# Patient Record
Sex: Male | Born: 1985 | Race: Black or African American | Hispanic: No | Marital: Single | Smoking: Current every day smoker
Health system: Southern US, Community
[De-identification: ages and names within clinical notes are randomized; demographics above are authoritative.]

## PROBLEM LIST (undated history)

## (undated) DIAGNOSIS — R51 Headache: Secondary | ICD-10-CM

## (undated) DIAGNOSIS — R519 Headache, unspecified: Secondary | ICD-10-CM

## (undated) DIAGNOSIS — G510 Bell's palsy: Secondary | ICD-10-CM

## (undated) DIAGNOSIS — J45909 Unspecified asthma, uncomplicated: Secondary | ICD-10-CM

---
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
## 2007-09-26 ENCOUNTER — Emergency Department (HOSPITAL_COMMUNITY): Admission: EM | Admit: 2007-09-26 | Discharge: 2007-09-26 | Payer: Self-pay | Admitting: Emergency Medicine

## 2009-09-27 ENCOUNTER — Observation Stay (HOSPITAL_COMMUNITY): Admission: EM | Admit: 2009-09-27 | Discharge: 2009-09-27 | Payer: Self-pay | Admitting: Emergency Medicine

## 2009-10-26 ENCOUNTER — Emergency Department (HOSPITAL_COMMUNITY): Admission: EM | Admit: 2009-10-26 | Discharge: 2009-10-26 | Payer: Self-pay | Admitting: Emergency Medicine

## 2012-03-30 ENCOUNTER — Emergency Department (HOSPITAL_COMMUNITY)
Admission: EM | Admit: 2012-03-30 | Discharge: 2012-03-31 | Disposition: A | Discharge status: Home / Self Care | Payer: Self-pay | Attending: Emergency Medicine | Admitting: Emergency Medicine

## 2012-03-30 ENCOUNTER — Encounter (HOSPITAL_COMMUNITY): Payer: Self-pay | Admitting: Adult Health

## 2012-03-30 DIAGNOSIS — G510 Bell's palsy: Secondary | ICD-10-CM

## 2012-03-30 DIAGNOSIS — F17200 Nicotine dependence, unspecified, uncomplicated: Secondary | ICD-10-CM | POA: Insufficient documentation

## 2012-03-30 DIAGNOSIS — R29810 Facial weakness: Secondary | ICD-10-CM | POA: Insufficient documentation

## 2012-03-30 HISTORY — DX: Headache, unspecified: R51.9

## 2012-03-30 HISTORY — DX: Headache: R51

## 2012-03-30 NOTE — ED Notes (Signed)
Reports one week of left sided facial paralysis that began one week ago, unable to left left eyebrow, unable to smile with left side of face associated with more frequent headaches than normal. Alert and oriented, mae x 4.

## 2012-03-31 MED ORDER — ACYCLOVIR 400 MG PO TABS: 400.0000 mg | ORAL_TABLET | Freq: Four times a day (QID) | ORAL | Status: AC

## 2012-03-31 MED ORDER — ARTIFICIAL TEARS OP OINT
TOPICAL_OINTMENT | OPHTHALMIC | Status: DC | PRN
  Administered 2012-03-31: 1 via OPHTHALMIC
  Filled 2012-03-31: qty 3.5

## 2012-03-31 MED ORDER — PREDNISONE 20 MG PO TABS: ORAL_TABLET | ORAL | Status: AC

## 2012-03-31 NOTE — ED Provider Notes (Signed)
History     CSN: [id number]  Arrival date & time 03/30/12  2100   First MD Initiated Contact with Patient 03/31/12 0015      Chief Complaint  Patient presents with  . Facial Droop    left sided facial paralysis that began one week ago    (Consider location/radiation/quality/duration/timing/severity/associated sxs/prior treatment) HPI History provided by patient. Left-sided facial droop the last week unable to close left eye. Has not been evaluated for this. No recent cough cold or congestion. Denies any recent nausea vomiting diarrhea or illness otherwise. No history of same. No headache. No trouble with vision. No weakness or numbness otherwise. No trouble with gait. Denies any drug use. No fall or trauma. Moderate in severity. Denies recent tick bites or rash. No ear pain or drainage. Past Medical History  Diagnosis Date  . Persistent headaches     History reviewed. No pertinent past surgical history.  History reviewed. No pertinent family history.  History  Substance Use Topics  . Smoking status: Current Everyday Smoker  . Smokeless tobacco: Not on file  . Alcohol Use: Yes      Review of Systems  Constitutional: Negative for fever and chills.  HENT: Negative for neck pain and neck stiffness.   Eyes: Negative for pain.  Respiratory: Negative for shortness of breath.   Cardiovascular: Negative for chest pain.  Gastrointestinal: Negative for abdominal pain.  Genitourinary: Negative for dysuria.  Musculoskeletal: Negative for back pain.  Skin: Negative for rash.  Neurological: Positive for facial asymmetry. Negative for headaches.  All other systems reviewed and are negative.    Allergies  Review of patient's allergies indicates no known allergies.  Home Medications   Current Outpatient Rx  Name Route Sig Dispense Refill  . IBUPROFEN 200 MG PO TABS Oral Take 200 mg by mouth every 6 (six) hours as needed. For pain      BP 113/67  Pulse 78  Temp 98.9 F  (37.2 C) (Oral)  Resp 18  SpO2 100%  Physical Exam  Constitutional: He is oriented to person, place, and time. He appears well-developed and well-nourished.  HENT:  Head: Normocephalic and atraumatic.  Right Ear: External ear normal.  Left Ear: External ear normal.  Nose: Nose normal.  Mouth/Throat: Oropharynx is clear and moist.  Eyes: Conjunctivae and EOM are normal. Pupils are equal, round, and reactive to light.  Neck: Trachea normal. Neck supple. No thyromegaly present.  Cardiovascular: Normal rate, regular rhythm, S1 normal, S2 normal and normal pulses.     No systolic murmur is present   No diastolic murmur is present  Pulses:      Radial pulses are 2+ on the right side, and 2+ on the left side.  Pulmonary/Chest: Effort normal and breath sounds normal. He has no wheezes. He has no rhonchi. He has no rales. He exhibits no tenderness.  Abdominal: Soft. Normal appearance and bowel sounds are normal. There is no tenderness. There is no CVA tenderness and negative Murphy's sign.  Musculoskeletal:       BLE:s Calves nontender, no cords or erythema, negative Homans sign  Neurological: He is alert and oriented to person, place, and time. He has normal strength. No sensory deficit. GCS eye subscore is 4. GCS verbal subscore is 5. GCS motor subscore is 6.       Left cranial nerve 7 deficit involving forehead and consistent with Bell's palsy.  Skin: Skin is warm and dry. No rash noted. He is not diaphoretic.  Psychiatric:  His speech is normal.       Cooperative and appropriate    ED Course  Procedures (including critical care time)  Unable to fully close left eye  - Lacri-Lube provided and patient agrees to use over-the-counter saline drops until able to close his eye.   MDM   Left facial weakness consistent with Bell's palsy. No rash or recent tick bite / no obvious Lyme disease. Plan acyclovir and prednisone and followup as an outpatient in the clinic. Patient states  understanding need for lubrication left eye while unable to close it.         Sunnie Nielsen, MD 03/31/12 [phone]

## 2012-03-31 NOTE — Discharge Instructions (Signed)
Bell's Palsy Bell's palsy is a condition in which the muscles on one side of the face cannot move (paralysis). This is because the nerves in the face are paralyzed. It is most often thought to be caused by a virus. The virus causes swelling of the nerve that controls movement on one side of the face. The nerve travels through a tight space surrounded by bone. When the nerve swells, it can be compressed by the bone. This results in damage to the protective covering around the nerve. This damage interferes with how the nerve communicates with the muscles of the face. As a result, it can cause weakness or paralysis of the facial muscles.  Injury (trauma), tumor, and surgery may cause Bell's palsy, but most of the time the cause is unknown. It is a relatively common condition. It starts suddenly (abrupt onset) with the paralysis usually ending within 2 days. Bell's palsy is not dangerous. But because the eye does not close properly, you may need care to keep the eye from getting dry. This can include splinting (to keep the eye shut) or moistening with artificial tears. Bell's palsy very seldom occurs on both sides of the face at the same time. SYMPTOMS   Eyebrow sagging.   Drooping of the eyelid and corner of the mouth.   Inability to close one eye.   Loss of taste on the front of the tongue.   Sensitivity to loud noises.  TREATMENT  The treatment is usually non-surgical. If the patient is seen within the first 24 to 48 hours, a short course of steroids may be prescribed, in an attempt to shorten the length of the condition. Antiviral medicines may also be used with the steroids, but it is unclear if they are helpful.  You will need to protect your eye, if you cannot close it. The cornea (clear covering over your eye) will become dry and can be damaged. Artificial tears can be used to keep your eye moist. Glasses or an eye patch should be worn to protect your eye. PROGNOSIS  Recovery is variable,  ranging from days to months. Although the problem usually goes away completely (about 80% of cases resolve), predicting the outcome is impossible. Most people improve within 3 weeks of when the symptoms began. Improvement may continue for 3 to 6 months. A small number of people have moderate to severe weakness that is permanent.  HOME CARE INSTRUCTIONS   If your caregiver prescribed medication to reduce swelling in the nerve, use as directed. Do not stop taking the medication unless directed by your caregiver.   Use moisturizing eye drops as needed to prevent drying of your eye, as directed by your caregiver.   Protect your eye, as directed by your caregiver.   Use facial massage and exercises, as directed by your caregiver.   Perform your normal activities, and get your normal rest.  SEEK IMMEDIATE MEDICAL CARE IF:   There is pain, redness or irritation in the eye.   You or your child has an oral temperature above 102 F (38.9 C), not controlled by medicine.  MAKE SURE YOU:   Understand these instructions.   Will watch your condition.   Will get help right away if you are not doing well or get worse.  RESOURCE GUIDE  Chronic Pain Problems: Contact [facility]  [phone] Patients need to be referred by their primary care doctor.  Insufficient Money for Medicine: Contact [employer]:  call "211" or [facility] [phone].  No Primary Care Doctor: Call [facility]  [phone] - can help you locate a primary care doctor that  accepts your insurance, provides certain services, etc. Physician Referral Service- [phone]  Agencies that provide inexpensive medical care: [facility]  [phone] [facility]  [phone] [facility]  [phone] [facility]  [phone] [facility]  [phone] [facility]  [phone]  Medicaid-accepting [facility] Providers: [facility]- [address]  [phone], Mon-Fri 9am-7pm, Sat 9am-1pm [facility]- [address] [place], [address] [place]  [phone] [facility]- [address], [address] [place]  [phone] [facility]- [address]  [phone] Renaye R[facility]- [address] [place], [address], [phone]  Only accepts [place] [facility] [place] patients after they have their name  applied to their card  Self Pay (no insurance) in [facility]: Sickle Cell Patients: Dr Willey Blade, [facility]  [address] [place], [phone] [facility], [facility], [facility]- [address] [place]  [phone]       [facility]- [address]       -     [facility]- see information above (Speak to [employer] if you do not have insurance)       -  [facility]- [address] [place], [phone]       -  [facility]- [address],  [phone]       -  [facility]- [address], [phone]       -  Dr Julio Sicks-  [address], [place], [phone]       -  [facility]- [address], [phone]       -  [facility]- [address], [phone], also [address], [phone]       -    [facility]- [address] [place], [phone], 1st & 3rd Saturday   every month, 10am-1pm  1) Find a Doctor and Pay Out of Pocket Although you won't have to find out who is covered by your insurance plan, it is a good idea to ask around and get recommendations. You will then need to call the office and see if the doctor you have chosen will accept you as a new patient and what types of options they offer for patients who are self-pay. Some doctors offer discounts or will set up payment plans for their patients who do not have insurance, but you will need to ask so you aren't surprised when you get to your appointment.  2) Contact Your Local Health Department Not all  health departments have doctors that can see patients for sick visits, but many do, so it is worth a call to see if yours does. If you don't know where your local health department is, you can check in your phone book. The CDC also has a tool to help you locate your state's health department, and many state websites also have listings of all of their local health departments.  3) Find a Walk-in Clinic If your illness is not likely to be very severe or complicated, you may want to try a walk in clinic. These are popping up all over the country in pharmacies, drugstores, and shopping centers. They're usually staffed  by nurse practitioners or physician assistants that have been trained to treat common illnesses and complaints. They're usually fairly quick and inexpensive. However, if you have serious medical issues or chronic medical problems, these are probably not your best option    Emergency Shelter:  [facility] [phone]    Dental Assistance  If unable to pay or uninsured, contact:  [facility] or [facility]. to become qualified for the adult dental clinic.  Patients with Medicaid: [facility] [phone] [address], [phone] [address], [phone]

## 2012-08-01 ENCOUNTER — Emergency Department (HOSPITAL_COMMUNITY)
Admission: EM | Admit: 2012-08-01 | Discharge: 2012-08-01 | Disposition: A | Discharge status: Home / Self Care | Payer: Self-pay | Attending: Emergency Medicine | Admitting: Emergency Medicine

## 2012-08-01 ENCOUNTER — Encounter (HOSPITAL_COMMUNITY): Payer: Self-pay | Admitting: Emergency Medicine

## 2012-08-01 ENCOUNTER — Emergency Department (HOSPITAL_COMMUNITY): Payer: Self-pay

## 2012-08-01 DIAGNOSIS — Z8669 Personal history of other diseases of the nervous system and sense organs: Secondary | ICD-10-CM | POA: Insufficient documentation

## 2012-08-01 DIAGNOSIS — L089 Local infection of the skin and subcutaneous tissue, unspecified: Secondary | ICD-10-CM | POA: Insufficient documentation

## 2012-08-01 DIAGNOSIS — F17200 Nicotine dependence, unspecified, uncomplicated: Secondary | ICD-10-CM | POA: Insufficient documentation

## 2012-08-01 MED ORDER — CEPHALEXIN 500 MG PO CAPS: 500.0000 mg | ORAL_CAPSULE | Freq: Four times a day (QID) | ORAL | Status: DC

## 2012-08-01 NOTE — ED Notes (Addendum)
Pt presents to ED with c/o L middle finger swelling and 4/10 pain.  Pt reports hitting finger on something at work.  No drainage present.  Pt NAD. Reports soaking finger in Epson salt with no relief.

## 2012-08-01 NOTE — ED Notes (Signed)
Patient transported to X-ray 

## 2012-08-01 NOTE — ED Provider Notes (Signed)
Medical screening examination/treatment/procedure(s) were performed by non-physician practitioner and as supervising physician I was immediately available for consultation/collaboration.  Toy Baker, MD 08/01/12 2240

## 2012-08-01 NOTE — ED Provider Notes (Signed)
History     CSN: [id number]  Arrival date & time 08/01/12  2017   First MD Initiated Contact with Patient 08/01/12 2031      Chief Complaint  Patient presents with  . Hand Pain    (Consider location/radiation/quality/duration/timing/severity/associated sxs/prior treatment) HPI Comments: This is a 26 year old male, who presents to the ED with a chief complaint of left middle finger pain.  Patient denies injury.  Denies drainage.  Has not tried anything for pain.  He is in mild-moderate discomfort.  The pain does not radiate.  The history is provided by the patient. No language interpreter was used.    Past Medical History  Diagnosis Date  . Persistent headaches     History reviewed. No pertinent past surgical history.  No family history on file.  History  Substance Use Topics  . Smoking status: Current Every Day Smoker  . Smokeless tobacco: Not on file  . Alcohol Use: Yes      Review of Systems  Musculoskeletal:       Left middle finger pain  All other systems reviewed and are negative.    Allergies  Review of patient's allergies indicates no known allergies.  Home Medications  No current outpatient prescriptions on file.  BP 124/74  Pulse 96  Temp 98.1 F (36.7 C) (Oral)  Resp 15  Ht 5\' 11"  (1.803 m)  Wt 160 lb (72.576 kg)  BMI 22.32 kg/m2  SpO2 98%  Physical Exam  Nursing note and vitals reviewed. Constitutional: He is oriented to person, place, and time. He appears well-developed and well-nourished.  HENT:  Head: Normocephalic and atraumatic.  Eyes: Conjunctivae normal and EOM are normal. Pupils are equal, round, and reactive to light.  Neck: Normal range of motion. Neck supple.  Cardiovascular: Normal rate, regular rhythm and normal heart sounds.   Pulmonary/Chest: Effort normal and breath sounds normal.  Abdominal: Soft. Bowel sounds are normal.  Musculoskeletal:       Swollen left middle finger distal phalanx, no drainage, painful to  palpation, range of motion and strength intact  Neurological: He is alert and oriented to person, place, and time.  Skin: Skin is warm and dry.  Psychiatric: He has a normal mood and affect. His behavior is normal. Judgment and thought content normal.    ED Course  Procedures (including critical care time)  Labs Reviewed - No data to display Dg Hand Complete Left  08/01/2012  *RADIOLOGY REPORT*  Clinical Data: Injury to the long finger.  Pain.  LEFT HAND - COMPLETE 3+ VIEW  Comparison: None.  Findings: Imaged bones, joints and soft tissues appear normal.  IMPRESSION: Negative exam.   Original Report Authenticated By: Bernadene Bell. D'ALESSIO, M.D.      1. Finger infection       MDM  26 year old male with infection of middle left distal phalanx. Will prescribe Keflex. Encouraged patient to use warm compresses. Return precautions have been given. Patient is stable and ready for discharge.  This patient was discussed with and seen by Dr. Bruce Donath.      Roxy Horseman, PA-C 08/01/12 2123  Roxy Horseman, PA-C 08/01/12 2125

## 2014-01-06 ENCOUNTER — Emergency Department (HOSPITAL_COMMUNITY): Payer: Medicaid Other

## 2014-01-06 ENCOUNTER — Emergency Department (HOSPITAL_COMMUNITY)
Admission: EM | Admit: 2014-01-06 | Discharge: 2014-01-06 | Disposition: A | Discharge status: Home / Self Care | Payer: Medicaid Other | Attending: Emergency Medicine | Admitting: Emergency Medicine

## 2014-01-06 ENCOUNTER — Encounter (HOSPITAL_COMMUNITY): Payer: Self-pay | Admitting: Emergency Medicine

## 2014-01-06 DIAGNOSIS — R109 Unspecified abdominal pain: Secondary | ICD-10-CM | POA: Insufficient documentation

## 2014-01-06 DIAGNOSIS — R079 Chest pain, unspecified: Secondary | ICD-10-CM

## 2014-01-06 DIAGNOSIS — J45901 Unspecified asthma with (acute) exacerbation: Secondary | ICD-10-CM | POA: Insufficient documentation

## 2014-01-06 DIAGNOSIS — Z79899 Other long term (current) drug therapy: Secondary | ICD-10-CM | POA: Insufficient documentation

## 2014-01-06 DIAGNOSIS — F17200 Nicotine dependence, unspecified, uncomplicated: Secondary | ICD-10-CM | POA: Insufficient documentation

## 2014-01-06 DIAGNOSIS — R0789 Other chest pain: Secondary | ICD-10-CM | POA: Insufficient documentation

## 2014-01-06 HISTORY — DX: Unspecified asthma, uncomplicated: J45.909

## 2014-01-06 LAB — CBC WITH DIFFERENTIAL/PLATELET
BASOS ABS: 0 10*3/uL (ref 0.0–0.1)
BASOS PCT: 1 % (ref 0–1)
EOS ABS: 0.6 10*3/uL (ref 0.0–0.7)
EOS PCT: 10 % — AB (ref 0–5)
HEMATOCRIT: 45.1 % (ref 39.0–52.0)
Hemoglobin: 15.7 g/dL (ref 13.0–17.0)
Lymphocytes Relative: 33 % (ref 12–46)
Lymphs Abs: 2.2 10*3/uL (ref 0.7–4.0)
MCH: 31.6 pg (ref 26.0–34.0)
MCHC: 34.8 g/dL (ref 30.0–36.0)
MCV: 90.7 fL (ref 78.0–100.0)
MONO ABS: 0.8 10*3/uL (ref 0.1–1.0)
Monocytes Relative: 12 % (ref 3–12)
Neutro Abs: 2.9 10*3/uL (ref 1.7–7.7)
Neutrophils Relative %: 44 % (ref 43–77)
Platelets: 221 10*3/uL (ref 150–400)
RBC: 4.97 MIL/uL (ref 4.22–5.81)
RDW: 12.6 % (ref 11.5–15.5)
WBC: 6.5 10*3/uL (ref 4.0–10.5)

## 2014-01-06 LAB — TROPONIN I

## 2014-01-06 LAB — COMPREHENSIVE METABOLIC PANEL
ALT: 32 U/L (ref 0–53)
AST: 34 U/L (ref 0–37)
Albumin: 4.2 g/dL (ref 3.5–5.2)
Alkaline Phosphatase: 76 U/L (ref 39–117)
BUN: 14 mg/dL (ref 6–23)
CALCIUM: 9.9 mg/dL (ref 8.4–10.5)
CO2: 25 mEq/L (ref 19–32)
CREATININE: 1.2 mg/dL (ref 0.50–1.35)
Chloride: 104 mEq/L (ref 96–112)
GFR calc Af Amer: >90 mL/min (ref >90)
GFR, EST NON AFRICAN AMERICAN: 83 mL/min — AB (ref >90)
Glucose, Bld: 93 mg/dL (ref 70–99)
Potassium: 4.6 mEq/L (ref 3.7–5.3)
Sodium: 142 mEq/L (ref 137–147)
Total Bilirubin: 0.4 mg/dL (ref 0.3–1.2)
Total Protein: 7.6 g/dL (ref 6.0–8.3)

## 2014-01-06 LAB — LIPASE, BLOOD: Lipase: 22 U/L (ref 11–59)

## 2014-01-06 LAB — D-DIMER, QUANTITATIVE (NOT AT ARMC)

## 2014-01-06 MED ORDER — NAPROXEN 500 MG PO TABS: 500.0000 mg | ORAL_TABLET | Freq: Two times a day (BID) | ORAL | Status: DC

## 2014-01-06 MED ORDER — KETOROLAC TROMETHAMINE 30 MG/ML IJ SOLN
30.0000 mg | Freq: Once | INTRAMUSCULAR | Status: AC
  Administered 2014-01-06: 30 mg via INTRAVENOUS
  Filled 2014-01-06: qty 1

## 2014-01-06 MED ORDER — ONDANSETRON HCL 4 MG PO TABS: 4.0000 mg | ORAL_TABLET | Freq: Four times a day (QID) | ORAL | Status: DC

## 2014-01-06 MED ORDER — SODIUM CHLORIDE 0.9 % IV BOLUS (SEPSIS)
1000.0000 mL | Freq: Once | INTRAVENOUS | Status: AC
  Administered 2014-01-06: 1000 mL via INTRAVENOUS

## 2014-01-06 MED ORDER — ONDANSETRON HCL 4 MG/2ML IJ SOLN
4.0000 mg | Freq: Once | INTRAMUSCULAR | Status: AC
  Administered 2014-01-06: 4 mg via INTRAVENOUS
  Filled 2014-01-06: qty 2

## 2014-01-06 NOTE — Discharge Instructions (Signed)
Chest Pain (Nonspecific) °It is often hard to give a specific diagnosis for the cause of chest pain. There is always a chance that your pain could be related to something serious, such as a heart attack or a blood clot in the lungs. You need to follow up with your caregiver for further evaluation. °CAUSES  °· Heartburn. °· Pneumonia or bronchitis. °· Anxiety or stress. °· Inflammation around your heart (pericarditis) or lung (pleuritis or pleurisy). °· A blood clot in the lung. °· A collapsed lung (pneumothorax). It can develop suddenly on its own (spontaneous pneumothorax) or from injury (trauma) to the chest. °· Shingles infection (herpes zoster virus). °The chest wall is composed of bones, muscles, and cartilage. Any of these can be the source of the pain. °· The bones can be bruised by injury. °· The muscles or cartilage can be strained by coughing or overwork. °· The cartilage can be affected by inflammation and become sore (costochondritis). °DIAGNOSIS  °Lab tests or other studies, such as X-rays, electrocardiography, stress testing, or cardiac imaging, may be needed to find the cause of your pain.  °TREATMENT  °· Treatment depends on what may be causing your chest pain. Treatment may include: °· Acid blockers for heartburn. °· Anti-inflammatory medicine. °· Pain medicine for inflammatory conditions. °· Antibiotics if an infection is present. °· You may be advised to change lifestyle habits. This includes stopping smoking and avoiding alcohol, caffeine, and chocolate. °· You may be advised to keep your head raised (elevated) when sleeping. This reduces the chance of acid going backward from your stomach into your esophagus. °· Most of the time, nonspecific chest pain will improve within 2 to 3 days with rest and mild pain medicine. °HOME CARE INSTRUCTIONS  °· If antibiotics were prescribed, take your antibiotics as directed. Finish them even if you start to feel better. °· For the next few days, avoid physical  activities that bring on chest pain. Continue physical activities as directed. °· Do not smoke. °· Avoid drinking alcohol. °· Only take over-the-counter or prescription medicine for pain, discomfort, or fever as directed by your caregiver. °· Follow your caregiver's suggestions for further testing if your chest pain does not go away. °· Keep any follow-up appointments you made. If you do not go to an appointment, you could develop lasting (chronic) problems with pain. If there is any problem keeping an appointment, you must call to reschedule. °SEEK MEDICAL CARE IF:  °· You think you are having problems from the medicine you are taking. Read your medicine instructions carefully. °· Your chest pain does not go away, even after treatment. °· You develop a rash with blisters on your chest. °SEEK IMMEDIATE MEDICAL CARE IF:  °· You have increased chest pain or pain that spreads to your arm, neck, jaw, back, or abdomen. °· You develop shortness of breath, an increasing cough, or you are coughing up blood. °· You have severe back or abdominal pain, feel nauseous, or vomit. °· You develop severe weakness, fainting, or chills. °· You have a fever. °THIS IS AN EMERGENCY. Do not wait to see if the pain will go away. Get medical help at once. Call your local emergency services (911 in [place] Do not drive yourself to the hospital. °MAKE SURE YOU:  °· Understand these instructions. °· Will watch your condition. °· Will get help right away if you are not doing well or get worse. °Document Released: 07/02/2005 Document Revised: 12/15/2011 Document Reviewed: 04/27/2008 °ExitCare® Patient Information ©2014 [employer]   LL[employer]. ° °Abdominal Pain, Adult °Many things can cause abdominal pain. Usually, abdominal pain is not caused by a disease and will improve without treatment. It can often be observed and treated at home. Your health care provider will do a physical exam and possibly order blood tests and X-rays to help determine the  seriousness of your pain. However, in many cases, more time must pass before a clear cause of the pain can be found. Before that point, your health care provider may not know if you need more testing or further treatment. °HOME CARE INSTRUCTIONS  °Monitor your abdominal pain for any changes. The following actions may help to alleviate any discomfort you are experiencing: °· Only take over-the-counter or prescription medicines as directed by your health care provider. °· Do not take laxatives unless directed to do so by your health care provider. °· Try a clear liquid diet (broth, tea, or water) as directed by your health care provider. Slowly move to a bland diet as tolerated. °SEEK MEDICAL CARE IF: °· You have unexplained abdominal pain. °· You have abdominal pain associated with nausea or diarrhea. °· You have pain when you urinate or have a bowel movement. °· You experience abdominal pain that wakes you in the night. °· You have abdominal pain that is worsened or improved by eating food. °· You have abdominal pain that is worsened with eating fatty foods. °SEEK IMMEDIATE MEDICAL CARE IF:  °· Your pain does not go away within 2 hours. °· You have a fever. °· You keep throwing up (vomiting). °· Your pain is felt only in portions of the abdomen, such as the right side or the left lower portion of the abdomen. °· You pass bloody or black tarry stools. °MAKE SURE YOU: °· Understand these instructions.   °· Will watch your condition.   °· Will get help right away if you are not doing well or get worse.   °Document Released: 07/02/2005 Document Revised: 07/13/2013 Document Reviewed: 06/01/2013 °ExitCare® Patient Information ©2014 [employer]. ° °

## 2014-01-06 NOTE — ED Notes (Addendum)
Pt c/o central chest pain, SOB, and generalized abdominal pain x 4 days.  Pain score 10/10.  Denies n/v/d.  Reports "drink a bottle of water makes me feel full and like my stomach is in knots."  NAD noted.  Pt easily speaking in full sentences.

## 2014-01-06 NOTE — ED Provider Notes (Signed)
TIME SEEN: 7:30 PM  CHIEF COMPLAINT: Chest pain, abdominal pain, shortness of breath  HPI: Patient is a 28 year old male with a history of asthma who presents emergency department with 4 days of constant abdominal pain radiating into his chest and shortness of breath. He states his pain is worse with eating and better with sleeping. He is unable to describe the pain. He denies any nausea, vomiting or diarrhea. No fevers or chills. No melena or bright red blood per rectum. No history of PE or DVT. No lower extremity swelling or pain. No recent prolonged immobilization, surgery, fracture, hospitalization. No history of hypertension, diabetes or hyperlipidemia. Patient does smoke tobacco occasionally.  ROS: See HPI Constitutional: no fever  Eyes: no drainage  ENT: no runny nose   Cardiovascular:   chest pain  Resp:  SOB  GI: no vomiting GU: no dysuria Integumentary: no rash  Allergy: no hives  Musculoskeletal: no leg swelling  Neurological: no slurred speech ROS otherwise negative  PAST MEDICAL HISTORY/PAST SURGICAL HISTORY:  Past Medical History  Diagnosis Date  . Asthma     MEDICATIONS:  Prior to Admission medications   Medication Sig Start Date End Date Taking? Authorizing Provider  albuterol (PROVENTIL HFA;VENTOLIN HFA) 108 (90 BASE) MCG/ACT inhaler Inhale 2 puffs into the lungs every 6 (six) hours as needed for wheezing or shortness of breath.   Yes Historical Provider, MD  cetirizine (ZYRTEC) 10 MG tablet Take 10 mg by mouth daily.   Yes Historical Provider, MD  omeprazole (PRILOSEC) 20 MG capsule Take 20 mg by mouth 2 (two) times daily before a meal.   Yes Historical Provider, MD  sucralfate (CARAFATE) 1 GM/10ML suspension Take 2 g by mouth 4 (four) times daily -  with meals and at bedtime.   Yes Historical Provider, MD    ALLERGIES:  No Known Allergies  SOCIAL HISTORY:  History  Substance Use Topics  . Smoking status: Current Some Day Smoker    Types: Cigarettes  .  Smokeless tobacco: Not on file  . Alcohol Use: Yes     Comment: occ    FAMILY HISTORY: History reviewed. No pertinent family history.  EXAM: BP 126/72  Pulse 98  Temp(Src) 98.6 F (37 C) (Oral)  Resp 14  SpO2 95% CONSTITUTIONAL: Alert and oriented and responds appropriately to questions. Well-appearing; well-nourished, muscular, fit, no apparent distress HEAD: Normocephalic EYES: Conjunctivae clear, PERRL ENT: normal nose; no rhinorrhea; moist mucous membranes; pharynx without lesions noted NECK: Supple, no meningismus, no LAD  CARD: RRR; S1 and S2 appreciated; no murmurs, no clicks, no rubs, no gallops RESP: Normal chest excursion without splinting or tachypnea; breath sounds clear and equal bilaterally; no wheezes, no rhonchi, no rales, chest wall is nontender to palpation ABD/GI: Normal bowel sounds; non-distended; soft, diffuse tenderness to palpation without guarding or rebound, no peritoneal signs, negative Murphy sign the patient is more tender to palpation in his epigastric and right upper quadrant BACK:  The back appears normal and is non-tender to palpation, there is no CVA tenderness EXT: Normal ROM in all joints; non-tender to palpation; no edema; normal capillary refill; no cyanosis    SKIN: Normal color for age and race; warm NEURO: Moves all extremities equally PSYCH: The patient's mood and manner are appropriate. Grooming and personal hygiene are appropriate.  MEDICAL DECISION MAKING: Patient here with atypical chest pain. Suspect it is more gastric in nature. Concern for cholecystitis, cholelithiasis, pancreatitis. He does have an abnormal EKG but has no risk factors for  ACS. No risk factors for pulmonary embolus. Check abdominal labs in one set of cardiac enzymes. We'll also check d-dimer. Will obtain abdominal x-ray as well as right upper quadrant ultrasound. We'll give pain and nausea medication.  ED PROGRESS: Patient reports he feels much better after Toradol, IV  fluids and Zofran. His troponin is negative. D-dimer is also negative. Abdominal labs are within normal limits. His chest x-ray shows no acute abnormalities. There is no sign of obstruction or free air. His ultrasound showed no cholelithiasis or cholecystitis. Unclear etiology for patient's pain but not concerned for any life-threatening illness. We'll discharge patient home with return precautions. Have discussed his abnormal EKG but given he has no risk factors for ACS or pulmonary embolus and he is currently feeling much better and pain-free after Toradol, and not concerned for ACS, PE or dissection. Since he has had pain for 4 days constantly without any relief, I do not feel he needs serial set of enzymes given his troponin is negative currently. Patient and wife at bedside are comfortable with this plan.    EKG Interpretation  Date/Time:  Friday January 06 2014 18:40:18 EDT Ventricular Rate:  93 PR Interval:  137 QRS Duration: 78 QT Interval:  323 QTC Calculation: 402 R Axis:   75 Text Interpretation:  Sinus rhythm Probable left atrial enlargement Repol abnrm suggests ischemia, inferior leads Borderline ST elevation, lateral leads Baseline wander in lead(s) II III aVF No old tracing to compare Confirmed by WARD,  DO, KRISTEN [phone]) on 01/06/2014 8:03:58 PM        Layla Maw Ward, DO 01/06/14 2157

## 2016-10-28 ENCOUNTER — Emergency Department (HOSPITAL_COMMUNITY)
Admission: EM | Admit: 2016-10-28 | Discharge: 2016-10-28 | Disposition: A | Discharge status: Home / Self Care | Payer: 59 | Attending: Emergency Medicine | Admitting: Emergency Medicine

## 2016-10-28 ENCOUNTER — Encounter (HOSPITAL_COMMUNITY): Payer: Self-pay | Admitting: Emergency Medicine

## 2016-10-28 DIAGNOSIS — X58XXXA Exposure to other specified factors, initial encounter: Secondary | ICD-10-CM | POA: Insufficient documentation

## 2016-10-28 DIAGNOSIS — F17200 Nicotine dependence, unspecified, uncomplicated: Secondary | ICD-10-CM | POA: Insufficient documentation

## 2016-10-28 DIAGNOSIS — Y929 Unspecified place or not applicable: Secondary | ICD-10-CM | POA: Insufficient documentation

## 2016-10-28 DIAGNOSIS — Y939 Activity, unspecified: Secondary | ICD-10-CM | POA: Insufficient documentation

## 2016-10-28 DIAGNOSIS — S00451A Superficial foreign body of right ear, initial encounter: Secondary | ICD-10-CM

## 2016-10-28 DIAGNOSIS — T161XXA Foreign body in right ear, initial encounter: Secondary | ICD-10-CM | POA: Insufficient documentation

## 2016-10-28 DIAGNOSIS — Y999 Unspecified external cause status: Secondary | ICD-10-CM | POA: Diagnosis not present

## 2016-10-28 MED ORDER — CIPROFLOXACIN-DEXAMETHASONE 0.3-0.1 % OT SUSP
4.0000 [drp] | Freq: Once | OTIC | Status: AC
  Administered 2016-10-28: 4 [drp] via OTIC
  Filled 2016-10-28: qty 7.5

## 2016-10-28 MED ORDER — LIDOCAINE VISCOUS 2 % MT SOLN
15.0000 mL | Freq: Once | OROMUCOSAL | Status: AC
  Administered 2016-10-28: 15 mL via OROMUCOSAL
  Filled 2016-10-28: qty 15

## 2016-10-28 NOTE — ED Triage Notes (Signed)
Pt presents to ED because he states he can feel a bug crawling around in his ear.  Black shell bug noted in patient's ear in triage.

## 2016-10-28 NOTE — ED Provider Notes (Signed)
[facility]-EMERGENCY DEPT Provider Note   CSN: [id number] Arrival date & time: 10/28/16  [id number]     History   Chief Complaint Chief Complaint  Patient presents with  . foreign object in ear    HPI Brent Shaw is a 31 y.o. male.  Patient's presents with complaint of foreign body sensation in his right ear starting acutely upon waking up at 6 AM. Patient thinks that th is a bug in his ear. No treatments prior to arrival. No recent URI symptoms or fever. Patient states he can feel the insect moving. Nothing makes the symptoms better or worse.      Past Medical History:  Diagnosis Date  . Persistent headaches     There are no active problems to display for this patient.   History reviewed. No pertinent surgical history.     Home Medications    Prior to Admission medications   Medication Sig Start Date End Date Taking? Authorizing Provider  cephALEXin (KEFLEX) 500 MG capsule Take 1 capsule (500 mg total) by mouth 4 (four) times daily. 08/01/12   Roxy Horsemanobert Browning, PA-C    Family History History reviewed. No pertinent family history.  Social History Social History  Substance Use Topics  . Smoking status: Current Every Day Smoker    Packs/day: 0.25  . Smokeless tobacco: Never Used  . Alcohol use Yes     Allergies   Patient has no known allergies.   Review of Systems Review of Systems  Constitutional: Negative for fever.  HENT: Positive for ear pain. Negative for ear discharge and sore throat.      Physical Exam Updated Vital Signs BP 143/90 (BP Location: Left Arm)   Pulse 102   Temp 98.6 F (37 C) (Oral)   Resp 14   Ht 5\' 11"  (1.803 m)   Wt 95.3 kg   SpO2 100%   BMI 29.29 kg/m   Physical Exam  Constitutional: He appears well-developed and well-nourished.  HENT:  Head: Normocephalic and atraumatic.  Right Ear: External ear normal. A foreign body Carolin Coy(Roach) is present.  Left Ear: Tympanic membrane, external ear and ear canal normal.  Nose: Nose  normal.  Mouth/Throat: Oropharynx is clear and moist.  Eyes: Conjunctivae are normal.  Neck: Normal range of motion. Neck supple.     ED Treatments / Results   Procedures Procedures (including critical care time)  Medications Ordered in ED Medications  lidocaine (XYLOCAINE) 2 % viscous mouth solution 15 mL (not administered)     Initial Impression / Assessment and Plan / ED Course  I have reviewed the triage vital signs and the nursing notes.  Pertinent labs & imaging results that were available during my care of the patient were reviewed by me and considered in my medical decision making (see chart for details).     Patient seen and examined. Insect noted in left ear canal. Will place viscous lidocaine and irrigate ear.  Vital signs reviewed and are as follows: BP 143/90 (BP Location: Left Arm)   Pulse 102   Temp 98.6 F (37 C) (Oral)   Resp 14   Ht 5\' 11"  (1.803 m)   Wt 95.3 kg   SpO2 100%   BMI 29.29 kg/m   After administration of viscous lidocaine, multiple attempts (6-7) used to irrigate the canal with 18-gauge Angiocath. Attempts were made to grasp the insect with alligator forceps. Approximately one half of the insect was removed.  I asked the nurse to attempt irrigation and this was performed  several more times. The remainder of the insect remains stuck. Slight bleeding noted from ear canal.   At this point, will discharge patient with bulb syringe and Ciprodex ear drops. Instructed on their use. Patient encouraged to irrigate 4 times a day. If no insect parts are removed in the next 48 hours, patient should follow-up with ENT. Referral given.  Patient encouraged to return with increasing pain, redness, swelling around the ear, drainage from the ear canal.  It should be noted, I went back to the room to retreive my alligator forceps while patient was being discharged and they were missing.   Final Clinical Impressions(s) / ED Diagnoses   Final diagnoses:    Non-penetrating foreign body in ear canal, right, initial encounter   Partial removal of insects and ear canal. Home treatment as above. ENT follow-up as above. Discussed return instructions with patient.  New Prescriptions New Prescriptions   No medications on file     Renne Crigler, PA-C 10/28/16 1037    Doug Sou, MD 10/28/16 1659

## 2016-10-28 NOTE — Discharge Instructions (Signed)
Please read and follow all provided instructions.  Your diagnoses today include:  1. Non-penetrating foreign body in ear canal, right, initial encounter     Tests performed today include:  Vital signs. See below for your results today.   Medications prescribed:   Ciprodex ear drops - instill 4 drops into affected ear twice daily for 7 days  Take any prescribed medications only as directed.  Home care instructions:  Follow any educational materials contained in this packet.  Use bulb syringe to gently irrigate ear canal 5-6 times every 6 hours.  Follow-up instructions: Please follow-up with ENT if the remainder of the insect does not come out in the next 48 hours with home care.    Return instructions:   Please return to the Emergency Department if you experience worsening symptoms.   Return with drainage from the ear, increasing ear pain, redness or swelling around the ear  Please return if you have any other emergent concerns.  Additional Information:  Your vital signs today were: BP 143/90 (BP Location: Left Arm)    Pulse 102    Temp 98.6 F (37 C) (Oral)    Resp 14    Ht 5\' 11"  (1.803 m)    Wt 95.3 kg    SpO2 100%    BMI 29.29 kg/m  If your blood pressure (BP) was elevated above 135/85 this visit, please have this repeated by your doctor within one month. --------------

## 2016-10-28 NOTE — ED Notes (Signed)
Irrigated right ear with warm water several times.  PA Rhea BleacherJosh Geiple in room tried to used tiny forceps to retrieve bug without success.  He informed patient he would order him an\ antibiotic to use and a bulb syringe to lightly irrigate ear and if no results he would need to see an ENT doctor which he would provide a ENT to call. Patient watching show on phone.

## 2016-10-28 NOTE — ED Notes (Signed)
Verified size of Bulb syringe 2oz peds with PA Josh Geiple.  Showed patient how to gently irrigate right ear with understanding.  States no pain in ear as long as no one is working in Scientist, clinical (histocompatibility and immunogenetics)ear.

## 2016-10-28 NOTE — ED Notes (Signed)
PA at bedside irrigating ear.

## 2016-10-28 NOTE — ED Notes (Signed)
Pt states he understands instructions. All questions answered. Pt home ambulatory with steady gait,.

## 2017-01-09 ENCOUNTER — Encounter (HOSPITAL_COMMUNITY): Payer: Self-pay | Admitting: Emergency Medicine

## 2017-01-09 ENCOUNTER — Emergency Department (HOSPITAL_COMMUNITY)
Admission: EM | Admit: 2017-01-09 | Discharge: 2017-01-09 | Disposition: A | Discharge status: Home / Self Care | Payer: 59 | Attending: Emergency Medicine | Admitting: Emergency Medicine

## 2017-01-09 DIAGNOSIS — R29810 Facial weakness: Secondary | ICD-10-CM | POA: Diagnosis present

## 2017-01-09 DIAGNOSIS — F17200 Nicotine dependence, unspecified, uncomplicated: Secondary | ICD-10-CM | POA: Diagnosis not present

## 2017-01-09 DIAGNOSIS — G510 Bell's palsy: Secondary | ICD-10-CM | POA: Diagnosis not present

## 2017-01-09 HISTORY — DX: Bell's palsy: G51.0

## 2017-01-09 MED ORDER — CARBOXYMETHYLCELLULOSE SODIUM 1 % OP SOLN: 1.0000 [drp] | Freq: Three times a day (TID) | OPHTHALMIC | 0 refills | Status: AC

## 2017-01-09 MED ORDER — PREDNISONE 20 MG PO TABS: 40.0000 mg | ORAL_TABLET | Freq: Every day | ORAL | 0 refills | Status: AC

## 2017-01-09 MED ORDER — VALACYCLOVIR HCL 1 G PO TABS: 1000.0000 mg | ORAL_TABLET | Freq: Three times a day (TID) | ORAL | 0 refills | Status: AC

## 2017-01-09 NOTE — ED Triage Notes (Signed)
Pt sts right sided facial droop x 3 days; pt sts hx of bells palsy and appears the same

## 2017-01-09 NOTE — Discharge Instructions (Signed)
Take the medications as prescribed. Tape your eye closed when sleeping. Apply eyedrops as prescribed. Follow-up with family doctor.

## 2017-01-09 NOTE — ED Provider Notes (Signed)
[facility]-EM[facility]Y DEPT Provider Note   CSN: [id number] Arrival date & time: 01/09/17  1618     History   Chief Complaint Chief Complaint  Patient presents with  . Facial Droop    HPI Brent Shaw is a 31 y.o. male.  HPI Brent Shaw is a 31 y.o. male with hx of headaches and bell's palsy, presents to ED with complaint of right facial droop. Symptoms started 2 days ago. States has constant headaches and they have not changed. Denies any fever or chills. Denies any neck pain or stiffness. Denies any numbness or weakness to the rest of the extremities. Denies any blurred vision. Denies any head injuries. States feels just like prior Bell's palsy which he had approximately 3-4 years ago. Nothing making his symptoms better or worse. No treatment prior to coming in.  Past Medical History:  Diagnosis Date  . Bell's palsy   . Persistent headaches     There are no active problems to display for this patient.   History reviewed. No pertinent surgical history.     Home Medications    Prior to Admission medications   Not on File    Family History History reviewed. No pertinent family history.  Social History Social History  Substance Use Topics  . Smoking status: Current Every Day Smoker    Packs/day: 0.25  . Smokeless tobacco: Never Used  . Alcohol use Yes     Allergies   Patient has no known allergies.   Review of Systems Review of Systems  Constitutional: Negative for chills and fever.  HENT: Negative for congestion.   Eyes: Negative for photophobia and pain.  Respiratory: Negative for cough, chest tightness and shortness of breath.   Cardiovascular: Negative for chest pain, palpitations and leg swelling.  Gastrointestinal: Negative for abdominal distention, abdominal pain, diarrhea, nausea and vomiting.  Genitourinary: Negative for dysuria, frequency, hematuria and urgency.  Musculoskeletal: Negative for arthralgias, myalgias, neck pain and neck stiffness.   Skin: Negative for rash.  Allergic/Immunologic: Negative for immunocompromised state.  Neurological: Positive for weakness, numbness and headaches. Negative for dizziness and light-headedness.  All other systems reviewed and are negative.    Physical Exam Updated Vital Signs BP 130/84 (BP Location: Right Arm)   Pulse 97   Temp 98.7 F (37.1 C) (Oral)   Resp 18   Ht  (1.803 m)   Wt 93 kg   SpO2 98%   BMI 28.59 kg/m   Physical Exam  Constitutional: He appears well-developed and well-nourished. No distress.  HENT:  Head: Normocephalic and atraumatic.  Eyes: Conjunctivae and EOM are normal. Pupils are equal, round, and reactive to light.  Neck: Normal range of motion. Neck supple.  Cardiovascular: Normal rate, regular rhythm and normal heart sounds.   Pulmonary/Chest: Effort normal. No respiratory distress. He has no wheezes. He has no rales.  Abdominal: Soft. Bowel sounds are normal. He exhibits no distension. There is no tenderness. There is no rebound.  Musculoskeletal: He exhibits no edema.  Neurological: He is alert.  Right facial droop, unable to close right eye, no wrinkling of the forehead on the right side when raising eyebrows. No tongue deviation. Sensation intact bilaterally over all trigeminal nerve distributions. 5/5 and equal upper and lower extremity strength bilaterally. Equal grip strength bilaterally. Normal finger to nose and heel to shin. No pronator drift. Patellar reflexes 2+   Skin: Skin is warm and dry.  [id number] and vitals reviewed.    ED Treatments / Results  Labs (all labs ordered are listed, but only abnormal results are displayed) Labs Reviewed - No data to display  EKG  EKG Interpretation None       Radiology No results found.  Procedures Procedures (including critical care time)  Medications Ordered in ED Medications - No data to display   Initial Impression / Assessment and Plan / ED Course  I have reviewed the  triage vital signs and the nursing notes.  Pertinent labs & imaging results that were available during my care of the patient were reviewed by me and considered in my medical decision making (see chart for details).     Patient in emergency department with a right facial weakness and drooping. His exam is most consistent with Bell's palsy. Given his age, prior similar symptoms, his examination, I do not believe this is a CVA or any other acute intracranial process. We will treat him with Valtrex, prednisone, moisturizing eyedrops. Follow-up with family doctor as needed. Return precautions discussed.   Vitals:   01/09/17 1630  BP: 130/84  Pulse: 97  Resp: 18  Temp: 98.7 F (37.1 C)  TempSrc: Oral  SpO2: 98%  Weight: 93 kg  Height:  (1.803 m)     Final Clinical Impressions(s) / ED Diagnoses   Final diagnoses:  Bell's palsy    New Prescriptions New Prescriptions   CARBOXYMETHYLCELLULOSE 1 % OPHTHALMIC SOLUTION    Apply 1 drop to eye 3 (three) times daily.   PREDNISONE (DELTASONE) 20 MG TABLET    Take 2 tablets (40 mg total) by mouth daily.   VALACYCLOVIR (VALTREX) 1000 MG TABLET    Take 1 tablet (1,000 mg total) by mouth 3 (three) times daily.     Jaynie Crumble, PA-C 01/09/17 1909    Derwood Kaplan, MD 01/18/17 [phone]

## 2018-02-18 DIAGNOSIS — J3089 Other allergic rhinitis: Secondary | ICD-10-CM | POA: Insufficient documentation

## 2018-02-18 DIAGNOSIS — J4540 Moderate persistent asthma, uncomplicated: Secondary | ICD-10-CM | POA: Insufficient documentation

## 2019-10-09 ENCOUNTER — Emergency Department (HOSPITAL_COMMUNITY)
Admission: EM | Admit: 2019-10-09 | Discharge: 2019-10-09 | Disposition: A | Discharge status: Home / Self Care | Payer: BC Managed Care – PPO | Attending: Emergency Medicine | Admitting: Emergency Medicine

## 2019-10-09 ENCOUNTER — Emergency Department (HOSPITAL_COMMUNITY): Payer: BC Managed Care – PPO

## 2019-10-09 ENCOUNTER — Encounter (HOSPITAL_COMMUNITY): Payer: Self-pay | Admitting: Emergency Medicine

## 2019-10-09 ENCOUNTER — Other Ambulatory Visit: Payer: Self-pay

## 2019-10-09 DIAGNOSIS — M791 Myalgia, unspecified site: Secondary | ICD-10-CM | POA: Insufficient documentation

## 2019-10-09 DIAGNOSIS — Z79899 Other long term (current) drug therapy: Secondary | ICD-10-CM | POA: Insufficient documentation

## 2019-10-09 DIAGNOSIS — F17210 Nicotine dependence, cigarettes, uncomplicated: Secondary | ICD-10-CM | POA: Diagnosis not present

## 2019-10-09 DIAGNOSIS — J45909 Unspecified asthma, uncomplicated: Secondary | ICD-10-CM | POA: Diagnosis not present

## 2019-10-09 DIAGNOSIS — R531 Weakness: Secondary | ICD-10-CM | POA: Diagnosis not present

## 2019-10-09 DIAGNOSIS — R509 Fever, unspecified: Secondary | ICD-10-CM | POA: Insufficient documentation

## 2019-10-09 DIAGNOSIS — R05 Cough: Secondary | ICD-10-CM | POA: Diagnosis present

## 2019-10-09 DIAGNOSIS — R0602 Shortness of breath: Secondary | ICD-10-CM | POA: Insufficient documentation

## 2019-10-09 DIAGNOSIS — Z20822 Contact with and (suspected) exposure to covid-19: Secondary | ICD-10-CM | POA: Insufficient documentation

## 2019-10-09 LAB — CBC
HCT: 51.6 % (ref 39.0–52.0)
Hemoglobin: 17.1 g/dL — ABNORMAL HIGH (ref 13.0–17.0)
MCH: 30.2 pg (ref 26.0–34.0)
MCHC: 33.1 g/dL (ref 30.0–36.0)
MCV: 91 fL (ref 80.0–100.0)
Platelets: 188 10*3/uL (ref 150–400)
RBC: 5.67 MIL/uL (ref 4.22–5.81)
RDW: 11.4 % — ABNORMAL LOW (ref 11.5–15.5)
WBC: 3 10*3/uL — ABNORMAL LOW (ref 4.0–10.5)
nRBC: 0 % (ref 0.0–0.2)

## 2019-10-09 LAB — URINALYSIS, ROUTINE W REFLEX MICROSCOPIC
Bilirubin Urine: NEGATIVE
Glucose, UA: NEGATIVE mg/dL
Hgb urine dipstick: NEGATIVE
Ketones, ur: NEGATIVE mg/dL
Leukocytes,Ua: NEGATIVE
Nitrite: NEGATIVE
Protein, ur: NEGATIVE mg/dL
Specific Gravity, Urine: 1.025 (ref 1.005–1.030)
pH: 5 (ref 5.0–8.0)

## 2019-10-09 LAB — COMPREHENSIVE METABOLIC PANEL
ALT: 65 U/L — ABNORMAL HIGH (ref 0–44)
AST: 65 U/L — ABNORMAL HIGH (ref 15–41)
Albumin: 4.1 g/dL (ref 3.5–5.0)
Alkaline Phosphatase: 70 U/L (ref 38–126)
Anion gap: 9 (ref 5–15)
BUN: 9 mg/dL (ref 6–20)
CO2: 22 mmol/L (ref 22–32)
Calcium: 9.4 mg/dL (ref 8.9–10.3)
Chloride: 106 mmol/L (ref 98–111)
Creatinine, Ser: 1.07 mg/dL (ref 0.61–1.24)
GFR calc Af Amer: >60 mL/min (ref >60)
GFR calc non Af Amer: >60 mL/min (ref >60)
Glucose, Bld: 106 mg/dL — ABNORMAL HIGH (ref 70–99)
Potassium: 3.7 mmol/L (ref 3.5–5.1)
Sodium: 137 mmol/L (ref 135–145)
Total Bilirubin: 0.5 mg/dL (ref 0.3–1.2)
Total Protein: 7.2 g/dL (ref 6.5–8.1)

## 2019-10-09 LAB — LIPASE, BLOOD: Lipase: 21 U/L (ref 11–51)

## 2019-10-09 MED ORDER — KETOROLAC TROMETHAMINE 15 MG/ML IJ SOLN
15.0000 mg | Freq: Once | INTRAMUSCULAR | Status: AC
  Administered 2019-10-09: 17:00:00 15 mg via INTRAMUSCULAR
  Filled 2019-10-09: qty 1

## 2019-10-09 MED ORDER — ACETAMINOPHEN 500 MG PO TABS
1000.0000 mg | ORAL_TABLET | Freq: Once | ORAL | Status: AC
  Administered 2019-10-09: 1000 mg via ORAL
  Filled 2019-10-09: qty 2

## 2019-10-09 MED ORDER — IPRATROPIUM BROMIDE HFA 17 MCG/ACT IN AERS
2.0000 | INHALATION_SPRAY | Freq: Once | RESPIRATORY_TRACT | Status: AC
  Administered 2019-10-09: 2 via RESPIRATORY_TRACT
  Filled 2019-10-09: qty 12.9

## 2019-10-09 MED ORDER — BENZONATATE 100 MG PO CAPS: 100.0000 mg | ORAL_CAPSULE | Freq: Three times a day (TID) | ORAL | 0 refills | Status: DC

## 2019-10-09 MED ORDER — ALBUTEROL SULFATE HFA 108 (90 BASE) MCG/ACT IN AERS
8.0000 | INHALATION_SPRAY | Freq: Once | RESPIRATORY_TRACT | Status: AC
  Administered 2019-10-09: 17:00:00 8 via RESPIRATORY_TRACT
  Filled 2019-10-09: qty 6.7

## 2019-10-09 MED ORDER — PREDNISONE 50 MG PO TABS
50.0000 mg | ORAL_TABLET | Freq: Every day | ORAL | 0 refills | Status: AC
Start: 2019-10-09 — End: 2019-10-14

## 2019-10-09 MED ORDER — SODIUM CHLORIDE 0.9% FLUSH: 3.0000 mL | Freq: Once | INTRAVENOUS | Status: DC

## 2019-10-09 NOTE — ED Triage Notes (Signed)
C/o sob, non-productive cough, body aches, and generalized abd pain since yesterday.  States tested neg for flu at PCP yesterday and COVID is pending.

## 2019-10-09 NOTE — ED Provider Notes (Signed)
[facility] EMERGENCY DEPARTMENT Provider Note   CSN: [id number] Arrival date & time: 10/09/19  1109     History Chief Complaint  Patient presents with  . Shortness of Breath  . Abdominal Pain    Brian Moreno is a 34 y.o. male presenting for evaluation of cough, fever, shortness of breath, abdominal pain, myalgias.  Patient states he woke up yesterday and felt poorly.  He reports generalized weakness, myalgias, and fever.  T-max 102.  He was seen at his primary care office, tested negative for the flu.  He was tested for Covid, results are pending.  He was given ibuprofen, but no other medication.  He did not receive a chest x-ray.  He has taken ibuprofen with mild improvement of symptoms, but has not taken anything else.  Patient states he has a history of asthma, has been using his inhaler more frequently today.  He reports mild increase shortness of breath.  He denies sick contacts or known contact with COVID-19 positive person.  He denies tobacco, alcohol, or drug use.  He denies loss of taste or smell.  He denies chest pain, nausea, vomiting, or diarrhea.  HPI     Past Medical History:  Diagnosis Date  . Asthma     There are no problems to display for this patient.   History reviewed. No pertinent surgical history.     No family history on file.  Social History   Tobacco Use  . Smoking status: Current Some Day Smoker    Types: Cigarettes  Substance Use Topics  . Alcohol use: Yes    Comment: occ  . Drug use: No    Home Medications Prior to Admission medications   Medication Sig Start Date End Date Taking? Authorizing Provider  acetaminophen (TYLENOL) 650 MG CR tablet Take 650 mg by mouth every 8 (eight) hours as needed for pain.   Yes [provider]  ADVAIR HFA 230-21 MCG/ACT inhaler Inhale 2 puffs into the lungs every 12 (twelve) hours. 08/07/19  Yes [provider]  albuterol (PROVENTIL HFA;VENTOLIN HFA) 108 (90 BASE)  MCG/ACT inhaler Inhale 2 puffs into the lungs every 6 (six) hours as needed for wheezing or shortness of breath.   Yes [provider]  cetirizine (ZYRTEC) 10 MG tablet Take 10 mg by mouth daily.   Yes [provider]  omeprazole (PRILOSEC) 20 MG capsule Take 20 mg by mouth 2 (two) times daily before a meal.   Yes [provider]  benzonatate (TESSALON) 100 MG capsule Take 1 capsule (100 mg total) by mouth every 8 (eight) hours. 10/09/19   Ovie Cornelio, PA-C  naproxen (NAPROSYN) 500 MG tablet Take 1 tablet (500 mg total) by mouth 2 (two) times daily. Patient not taking: Reported on 10/09/2019 01/06/14   Ward, Layla Maw, DO  ondansetron (ZOFRAN) 4 MG tablet Take 1 tablet (4 mg total) by mouth every 6 (six) hours. Patient not taking: Reported on 10/09/2019 01/06/14   Ward, Layla Maw, DO  predniSONE (DELTASONE) 50 MG tablet Take 1 tablet (50 mg total) by mouth daily for 5 days. 10/09/19 10/14/19  Waneta Fitting, PA-C    Allergies    Patient has no known allergies.  Review of Systems   Review of Systems  Constitutional: Positive for fever.  Respiratory: Positive for cough and shortness of breath.   Gastrointestinal: Positive for abdominal pain.  Musculoskeletal: Positive for myalgias.  Neurological: Positive for headaches.  All other systems reviewed and are negative.  Physical Exam Updated Vital Signs BP 126/83   Pulse 80   Temp 99.1 F (37.3 C) (Oral)   Resp 15   Ht 6' (1.829 m)   Wt 86.2 kg   SpO2 94%   BMI 25.77 kg/m   Physical Exam Vitals and nursing note reviewed.  Constitutional:      General: He is not in acute distress.    Appearance: He is well-developed.     Comments: Resting comfortably in the bed in no acute distress  HENT:     Head: Normocephalic and atraumatic.  Eyes:     Conjunctiva/sclera: Conjunctivae normal.     Pupils: Pupils are equal, round, and reactive to light.  Cardiovascular:     Rate and Rhythm: Normal rate and regular  rhythm.     Pulses: Normal pulses.  Pulmonary:     Effort: Pulmonary effort is normal. No respiratory distress.     Breath sounds: Wheezing present.     Comments: Diffuse expiratory wheezes noted.  Speaking in full sentences.  Sats stable on room air. Abdominal:     General: There is no distension.     Palpations: Abdomen is soft. There is no mass.     Tenderness: There is no abdominal tenderness. There is no guarding or rebound.     Comments: No tenderness to palpation the abdomen.  Soft without rigidity, guarding, distention.  Negative rebound.  Musculoskeletal:        General: Normal range of motion.     Cervical back: Normal range of motion and neck supple.     Right lower leg: No edema.     Left lower leg: No edema.  Skin:    General: Skin is warm and dry.     Capillary Refill: Capillary refill takes less than 2 seconds.  Neurological:     Mental Status: He is alert and oriented to person, place, and time.     ED Results / Procedures / Treatments   Labs (all labs ordered are listed, but only abnormal results are displayed) Labs Reviewed  COMPREHENSIVE METABOLIC PANEL - Abnormal; Notable for the following components:      Result Value   Glucose, Bld 106 (*)    AST 65 (*)    ALT 65 (*)    All other components within normal limits  CBC - Abnormal; Notable for the following components:   WBC 3.0 (*)    Hemoglobin 17.1 (*)    RDW 11.4 (*)    All other components within normal limits  LIPASE, BLOOD  URINALYSIS, ROUTINE W REFLEX MICROSCOPIC    EKG None  Radiology DG Chest Port 1 View  Result Date: 10/09/2019 CLINICAL DATA:  Possible COVID, shortness of breath, cough EXAM: PORTABLE CHEST 1 VIEW COMPARISON:  11/25/2015 FINDINGS: The heart size and mediastinal contours are within normal limits. Both lungs are clear. The visualized skeletal structures are unremarkable. IMPRESSION: No acute abnormality of the lungs in AP portable projection. Electronically Signed   By: Eddie Candle M.D.   On: 10/09/2019 17:36    Procedures Procedures (including critical care time)  Medications Ordered in ED Medications  sodium chloride flush (NS) 0.9 % injection 3 mL (has no administration in time range)  ketorolac (TORADOL) 15 MG/ML injection 15 mg (15 mg Intramuscular Given 10/09/19 1721)  acetaminophen (TYLENOL) tablet 1,000 mg (1,000 mg Oral Given 10/09/19 1724)  albuterol (VENTOLIN HFA) 108 (90 Base) MCG/ACT inhaler 8 puff (8 puffs Inhalation Given 10/09/19 1727)  ipratropium (ATROVENT  HFA) inhaler 2 puff (2 puffs Inhalation Given 10/09/19 1725)    ED Course  I have reviewed the triage vital signs and the nursing notes.  Pertinent labs & imaging results that were available during my care of the patient were reviewed by me and considered in my medical decision making (see chart for details).    MDM Rules/Calculators/A&P                      Patient presenting for evaluation of flulike symptoms.  Physical exam shows patient appears nontoxic.  Pulmonary exam shows sat stable on room air.  He does have expiratory wheezes, will give albuterol, ipratropium, and reassess.  Toradol and Tylenol given for headache and body aches.  Abdominal exam is reassuring, no tenderness palpation.  As such, I do not believe he needs CT.  Labs obtained from triage show mild leukopenia at 3, once again consistent with viral illness.  AST and ALT both 65, likely due to viral illness.  Otherwise reassuring.  Electrolytes are stable.  Will order chest x-ray to ensure no obvious pneumonia.  X-ray viewed interpreted by me, no pneumonia, proximal effusion, cardiomegaly.  On reassessment, wheezing is resolved.  Patient states he is feeling better.  I discussed continued symptomatic treatment at home.  As he has asthma, will give prednisone to help with bronchospasm.  Discussed use of albuterol and symptomatic medicines.  Discussed importance of quarantine, as I have high suspicion for coronavirus.  As patient  was tested yesterday and results are not back, will not retest today.  Discussed close monitoring of respiratory status, and return if worsening.  At this time, patient appears safe for discharge.  Return precautions given.  Patient states he understands and agrees to plan.  Rashaud Ybarbo was evaluated in Emergency Department on 10/09/2019 for the symptoms described in the history of present illness. He was evaluated in the context of the global COVID-19 pandemic, which necessitated consideration that the patient might be at risk for infection with the SARS-CoV-2 virus that causes COVID-19. Institutional protocols and algorithms that pertain to the evaluation of patients at risk for COVID-19 are in a state of rapid change based on information released by regulatory bodies including the CDC and federal and state organizations. These policies and algorithms were followed during the patient's care in the ED.  Final Clinical Impression(s) / ED Diagnoses Final diagnoses:  Suspected COVID-19 virus infection    Rx / DC Orders ED Discharge Orders         Ordered    predniSONE (DELTASONE) 50 MG tablet  Daily     10/09/19 1807    benzonatate (TESSALON) 100 MG capsule  Every 8 hours     10/09/19 1807           Claressa Hughley, PA-C 10/09/19 1827    Vanetta Mulders, MD 10/16/19 1004

## 2019-10-09 NOTE — ED Notes (Signed)
Brian Moreno [phone]) called for an update.

## 2019-10-09 NOTE — Discharge Instructions (Signed)
You likely have a viral illness.  This should be treated symptomatically. You should assume that you have coronavirus, thus following the guidelines listed below. Use Tylenol or ibuprofen as needed for fevers or body aches. Use tessalon for cough.  Take prednisone as prescribed for your asthma. Use your inhaler every 4-6 hours next 2 days.  After this, use only as needed for shortness of breath or cough. Make sure you stay well-hydrated with water. Wash your hands frequently to prevent spread of infection. Return to the emergency room if you develop chest pain, difficulty breathing, or any new or worsening symptoms.

## 2019-10-25 ENCOUNTER — Telehealth: Payer: Self-pay | Admitting: Internal Medicine

## 2019-10-25 NOTE — Telephone Encounter (Signed)
LMTCB x1 for pt's wife, Jamesetta Orleans.

## 2019-10-25 NOTE — Telephone Encounter (Signed)
Pt's wife calling in CB# [phone]

## 2019-10-26 ENCOUNTER — Encounter: Payer: Self-pay | Admitting: Internal Medicine

## 2019-10-26 ENCOUNTER — Other Ambulatory Visit: Payer: Self-pay

## 2019-10-26 ENCOUNTER — Ambulatory Visit (INDEPENDENT_AMBULATORY_CARE_PROVIDER_SITE_OTHER): Payer: BC Managed Care – PPO | Admitting: Internal Medicine

## 2019-10-26 DIAGNOSIS — J4540 Moderate persistent asthma, uncomplicated: Secondary | ICD-10-CM | POA: Diagnosis not present

## 2019-10-26 LAB — CBC WITH DIFFERENTIAL/PLATELET
Basophils Absolute: 0 10*3/uL (ref 0.0–0.1)
Basophils Relative: 0.4 % (ref 0.0–3.0)
Eosinophils Absolute: 0.3 10*3/uL (ref 0.0–0.7)
Eosinophils Relative: 5.4 % — ABNORMAL HIGH (ref 0.0–5.0)
HCT: 46.5 % (ref 39.0–52.0)
Hemoglobin: 15.7 g/dL (ref 13.0–17.0)
Lymphocytes Relative: 44.5 % (ref 12.0–46.0)
Lymphs Abs: 2.4 10*3/uL (ref 0.7–4.0)
MCHC: 33.8 g/dL (ref 30.0–36.0)
MCV: 90.6 fl (ref 78.0–100.0)
Monocytes Absolute: 0.5 10*3/uL (ref 0.1–1.0)
Monocytes Relative: 9.9 % (ref 3.0–12.0)
Neutro Abs: 2.1 10*3/uL (ref 1.4–7.7)
Neutrophils Relative %: 39.8 % — ABNORMAL LOW (ref 43.0–77.0)
Platelets: 261 10*3/uL (ref 150.0–400.0)
RBC: 5.13 Mil/uL (ref 4.22–5.81)
RDW: 12.4 % (ref 11.5–15.5)
WBC: 5.3 10*3/uL (ref 4.0–10.5)

## 2019-10-26 MED ORDER — PREDNISONE 10 MG PO TABS: ORAL_TABLET | ORAL | 0 refills | Status: DC

## 2019-10-26 MED ORDER — BUDESONIDE-FORMOTEROL FUMARATE 160-4.5 MCG/ACT IN AERO: INHALATION_SPRAY | RESPIRATORY_TRACT | 12 refills | Status: DC

## 2019-10-26 NOTE — Assessment & Plan Note (Addendum)
Onset at birth/ allergy rx by Kozlow up until the age of about 64  - 10/26/2019  After extensive coaching inhaler device,  effectiveness =    90% > start symb 160 2bid  - Allergy profile 10/26/2019 >  Eos 0. /  IgE  Pending   Way overusing saba.  Reviewed with pt:  If your breathing worsens or you need to use your rescue inhaler more than twice weekly or wake up more than twice a month with any respiratory symptoms or require more than two rescue inhalers per year, we need to see you right away because this means we're not controlling the underlying problem (inflammation) adequately.  Rescue inhalers (albuterol) do not control inflammation and overuse can lead to unnecessary and costly consequences.  They can make you feel better temporarily but eventually they will quit working effectively much as sleep aids lead to more insomnia if used regularly.    Rec:  pred x 6 days and continue gerd rx plus diet reviewed symb 160 2bid Return in 4 weeks with all meds in hand using a trust but verify approach to confirm accurate Medication  Reconciliation The principal here is that until we are certain that the  patients are doing what we've asked, it makes no sense to ask them to do more.          Each maintenance medication was reviewed in detail including emphasizing most importantly the difference between maintenance and prns and under what circumstances the prns are to be triggered using an action plan format that is not reflected in the computer generated alphabetically organized AVS which I have not found useful in most complex patients, especially with respiratory illnesses  Total time for H and P, chart review, counseling, teaching device and generating AVS / charting =  45 min

## 2019-10-26 NOTE — Patient Instructions (Addendum)
Plan A = Automatic = Always=    Symbicort 160 Take 2 puffs first thing in am and then another 2 puffs about 12 hours later.   Work on inhaler technique:  relax and gently blow all the way out then take a nice smooth deep breath back in, triggering the inhaler at same time you start breathing in.  Hold for up to 5 seconds if you can. Blow out thru nose. Rinse and gargle with water when done     Plan B = Backup (to supplement plan A, not to replace it) Only use your albuterol inhaler as a rescue medication to be used if you can't catch your breath by resting or doing a relaxed purse lip breathing pattern.  - The less you use it, the better it will work when you need it. - Ok to use the inhaler up to 2 puffs  every 4 hours if you must but call for appointment if use goes up over your usual need - Don't leave home without it !!  (think of it like the spare tire for your car)   Plan C = Crisis (instead of Plan B but only if Plan B stops working) - only use your albuterol nebulizer if you first try Plan B and it fails to help > ok to use the nebulizer up to every 4 hours but if start needing it regularly call for immediate appointment  Prednisone 10 mg take  4 each am x 2 days,   2 each am x 2 days,  1 each am x 2 days and stop  Continue prilosec Take 30- 60 min before your first and last meals of the day   GERD (REFLUX)  is an extremely common cause of respiratory symptoms just like yours , many times with no obvious heartburn at all.    It can be treated with medication, but also with lifestyle changes including elevation of the head of your bed (ideally with 6 -8inch blocks under the headboard of your bed),  Smoking cessation, avoidance of late meals, excessive alcohol, and avoid fatty foods, chocolate, peppermint, colas, red wine, and acidic juices such as orange juice.  NO MINT OR MENTHOL PRODUCTS SO NO COUGH DROPS  USE SUGARLESS CANDY INSTEAD (Jolley ranchers or Stover's or Life Savers) or even  ice chips will also do - the key is to swallow to prevent all throat clearing. NO OIL BASED VITAMINS - use powdered substitutes.  Avoid fish oil when coughing.      Please remember to go to the lab department   for your tests - we will call you with the results when they are available.      Please schedule a follow up office visit in 4 weeks, sooner if needed  with all medications /inhalers/ solutions in hand so we can verify exactly what you are taking. This includes all medications from all doctors and over the counters

## 2019-10-26 NOTE — Progress Notes (Signed)
Brian Moreno, male    DOB: 1986/07/29     MRN: [id number]   Brief patient profile:  34 yobmyobm  never smoker works for [employer] never smoker  with lifelong asthma previously under care of Kozlow on shots x years but stopped shots around age 34 never hosp, just taking albuterol prn  then placed on Advair in spring 2020 and no better so self-referred to pulmonary 10/26/2019 p several er visits under [facility] with viral uri  "croup" per ER impression rx with steroids with neg resp viral  Panel.     History of Present Illness  10/26/2019  Pulmonary/ 1st office eval/[facility]  Chief Complaint  Patient presents with  . 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 He states having increased SOB, dry cough and wheezing x 2 wks. He is using his albuterol about 10 x per day on average.   Dyspnea:  MMRC2 = can't walk a nl pace on a flat grade s sob but does fine slow and flat  Cough: dry /assoc with wheezing  Sleep: disturbed by breathing / cough  SABA use: way too much   No obvious day to day or daytime variability or assoc excess/ purulent sputum or mucus plugs or hemoptysis or cp or chest tightness,  or overt sinus or hb symptoms on ppi bid ac      Also denies any obvious fluctuation of symptoms with weather or environmental changes or other aggravating or alleviating factors except as outlined above   No unusual exposure hx or h/o childhood pna or knowledge of premature birth.  Current Allergies, Complete Past Medical History, Past Surgical History, Family History, and Social History were reviewed in [employer] [facility] record.  ROS  The following are not active complaints unless bolded Hoarseness, sore throat, dysphagia, dental problems, itching, sneezing,  nasal congestion or discharge of excess mucus or purulent secretions, ear ache,   fever, chills, sweats, unintended wt loss or wt gain, classically pleuritic or exertional cp,  orthopnea pnd or  arm/hand swelling  or leg swelling, presyncope, palpitations, abdominal pain, anorexia, nausea, vomiting, diarrhea  or change in bowel habits or change in bladder habits, change in stools or change in urine, dysuria, hematuria,  rash, arthralgias, visual complaints, headache, numbness, weakness or ataxia or problems with walking or coordination,  change in mood or  memory.           Past Medical History:  Diagnosis Date  . Asthma     Outpatient Medications Prior to Visit  Medication Sig Dispense Refill  . acetaminophen (TYLENOL) 650 MG CR tablet Take 650 mg by mouth every 8 (eight) hours as needed for pain.    Marland Kitchen ADVAIR HFA 230-21 MCG/ACT inhaler Inhale 2 puffs into the lungs every 12 (twelve) hours.    Marland Kitchen albuterol (PROVENTIL HFA;VENTOLIN HFA) 108 (90 BASE) MCG/ACT inhaler Inhale 2 puffs into the lungs every 6 (six) hours as needed for wheezing or shortness of breath.    . benzonatate (TESSALON) 100 MG capsule Take 1 capsule (100 mg total) by mouth every 8 (eight) hours. 21 capsule 0  . cetirizine (ZYRTEC) 10 MG tablet Take 10 mg by mouth daily.    Marland Kitchen ipratropium (ATROVENT HFA) 17 MCG/ACT inhaler Inhale 2 puffs into the lungs 3 (three) times daily.    Marland Kitchen omeprazole (PRILOSEC) 20 MG capsule Take 20 mg by mouth 2 (two) times daily before a meal.    . naproxen (NAPROSYN) 500  MG tablet Take 1 tablet (500 mg total) by mouth 2 (two) times daily. (Patient not taking: Reported on 10/09/2019) 30 tablet 0  . ondansetron (ZOFRAN) 4 MG tablet Take 1 tablet (4 mg total) by mouth every 6 (six) hours. (Patient not taking: Reported on 10/09/2019) 12 tablet 0   No facility-administered medications prior to visit.     Objective:     BP 110/74 (BP Location: Left Arm, Cuff Size: Normal)   Pulse 87   Temp (!) 97.2 F (36.2 C) (Temporal)   Ht 6' (1.829 m)   Wt 210 lb (95.3 kg)   SpO2 97% Comment: on RA  BMI 28.48 kg/m   SpO2: 97 %(on RA)   Pleasant amb bm nad    HEENT : pt wearing mask not removed for  exam due to covid -19 concerns.    NECK :  without JVD/Nodes/TM/ nl carotid upstrokes bilaterally   LUNGS: no acc muscle use,  Nl contour chest distant exp wheeze  bilaterally without cough on insp or exp maneuvers   CV:  RRR  no s3 or murmur or increase in P2, and no edema   ABD:  soft and nontender with nl inspiratory excursion in the supine position. No bruits or organomegaly appreciated, bowel sounds nl  MS:  Nl gait/ ext warm without deformities, calf tenderness, cyanosis or clubbing No obvious joint restrictions   SKIN: warm and dry without lesions    NEURO:  alert, approp, nl sensorium with  no motor or cerebellar deficits apparent.     I personally reviewed radiology impression as follows:  CXR:   10/14/19  Nl report - no mention of hyperinflation   Labs reviewed: covid /viral screen 10/14/19 neg  HCT  10/14/19  50.4     Assessment   No problem-specific Assessment & Plan notes found for this encounter.     Christinia Gully, MD 10/26/2019

## 2019-10-27 ENCOUNTER — Telehealth: Payer: Self-pay | Admitting: Internal Medicine

## 2019-10-27 ENCOUNTER — Institutional Professional Consult (permissible substitution): Payer: BC Managed Care – PPO | Admitting: Pulmonary Disease

## 2019-10-27 ENCOUNTER — Institutional Professional Consult (permissible substitution): Payer: BC Managed Care – PPO | Admitting: Internal Medicine

## 2019-10-27 DIAGNOSIS — J4540 Moderate persistent asthma, uncomplicated: Secondary | ICD-10-CM

## 2019-10-27 LAB — IGE: IgE (Immunoglobulin E), Serum: 310 kU/L — ABNORMAL HIGH (ref <=114)

## 2019-10-27 NOTE — Telephone Encounter (Signed)
LMTCB x1 for pt.  

## 2019-10-27 NOTE — Telephone Encounter (Signed)
Pt was seen already  after this message. Will close encounter.

## 2019-10-28 ENCOUNTER — Telehealth: Payer: Self-pay

## 2019-10-28 NOTE — Telephone Encounter (Signed)
ATC pt, no answer. Left message for pt to call back.  

## 2019-10-28 NOTE — Telephone Encounter (Signed)
Dr.Wert, Got refill request for Symbicort. Patient states   Patient states Symbicort inhaler not covered by insurance. Would like something else called into pharmacy. Pharmacy is [facility] [place]. Patient phone number is [phone].  Please advise on an alternative to be sent in.

## 2019-10-28 NOTE — Telephone Encounter (Signed)
Try dulera 200 2bid and if not taking it ok to give one breztri and ov with np in one week with formulary in hand for best option

## 2019-11-01 MED ORDER — DULERA 200-5 MCG/ACT IN AERO: 2.0000 | INHALATION_SPRAY | Freq: Two times a day (BID) | RESPIRATORY_TRACT | 3 refills | Status: DC

## 2019-11-01 NOTE — Telephone Encounter (Signed)
LMTCB  Before I close encounter will forward to Dr Sherene Sires to see if he has any thoughts on alternatives to symbcicort we could try calling in to see if insurance will cover Dr Sherene Sires, please advise. Thanks

## 2019-11-01 NOTE — Telephone Encounter (Signed)
Try dulera 200 2bid and if not covered then give breztri sample to use 2 bid and enough till can return with her formulary to see me or NP

## 2019-11-01 NOTE — Telephone Encounter (Signed)
LVM for patient to advise Brian Moreno was being sent to the [facility] with refills and to call our office if the medication does not help her or if pharmacy cannot provide due to insurance.   Nothing further needed at this time.

## 2019-11-08 NOTE — Progress Notes (Signed)
ATC, NA and no option to leave msg 

## 2019-11-17 NOTE — Progress Notes (Signed)
LMTCB on home phone ATC cell, NA and his VM is full

## 2019-11-18 ENCOUNTER — Encounter: Payer: Self-pay | Admitting: *Deleted

## 2019-11-18 NOTE — Progress Notes (Signed)
Letter mailed to the pt to call for results

## 2019-11-22 ENCOUNTER — Telehealth: Payer: Self-pay | Admitting: Internal Medicine

## 2019-11-22 NOTE — Telephone Encounter (Signed)
Brian Cowden, MD  11/03/2019 12:33 PM EST    Call patient : Study is c/w allergies > Be sure patient has/keeps f/u ov so we can go over all the details of this study and get a plan together moving forward - ok to move up f/u if not feeling better and wants to be seen sooner   Called and spoke with pt letting him know the results of lab work and stated to pt to keep f/u appt Thurs. 2/18. Pt verbalized understanding. Nothing further needed.

## 2019-11-24 ENCOUNTER — Ambulatory Visit: Payer: BC Managed Care – PPO | Admitting: Internal Medicine

## 2019-11-24 ENCOUNTER — Telehealth (INDEPENDENT_AMBULATORY_CARE_PROVIDER_SITE_OTHER): Payer: BC Managed Care – PPO | Admitting: Internal Medicine

## 2019-11-24 ENCOUNTER — Encounter: Payer: Self-pay | Admitting: Internal Medicine

## 2019-11-24 DIAGNOSIS — J4540 Moderate persistent asthma, uncomplicated: Secondary | ICD-10-CM | POA: Diagnosis not present

## 2019-11-24 MED ORDER — PREDNISONE 10 MG PO TABS: ORAL_TABLET | ORAL | 0 refills | Status: DC

## 2019-11-24 NOTE — Progress Notes (Signed)
Brian Moreno, male    DOB: [birth year]     MRN: [id number]   Brief patient profile:  34 yobm  never smoker works for [employer]  with lifelong asthma previously under care of Kozlow on shots x years but stopped shots around age 34 never hosp, just taking albuterol prn  then placed on Advair in spring 2020 and no better so self-referred to pulmonary 10/26/2019 p several er visits under [facility] with viral uri  "croup" per ER impression rx with steroids with neg resp viral  Panel.     History of Present Illness  10/26/2019  Pulmonary/ 1st office eval/[name]  Chief Complaint  Patient presents with  . 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 He states having increased SOB, dry cough and wheezing x 2 wks. He is using his albuterol about 10 x per day on average.   Dyspnea:  MMRC2 = can't walk a nl pace on a flat grade s sob but does fine slow and flat  Cough: dry /assoc with wheezing  Sleep: disturbed by breathing / cough  SABA use: way too much  rec Plan A = Automatic = Always=    Symbicort 160 Take 2 puffs first thing in am and then another 2 puffs about 12 hours later.  Work on inhaler technique:   Plan B = Backup (to supplement plan A, not to replace it) Only use your albuterol inhaler  Plan C = Crisis (instead of Plan B but only if Plan B stops working) - only use your albuterol nebulizer if you first try Plan B and it fails to help > ok to use the nebulizer up to every 4 hours but if start needing it regularly call for immediate appointment Prednisone 10 mg take  4 each am x 2 days,   2 each am x 2 days,  1 each am x 2 days and stop Continue prilosec Take 30- 60 min before your first and last meals of the day  GERD  .  Please schedule a follow up office visit in 4 weeks, sooner if needed  with all medications /inhalers/ solutions in hand so we can verify exactly what you are taking. This includes all medications from all doctors and over the counters    Virtual Visit via computer/   11/24/2019   I connected with Lavella Hammock on 11/24/19 at 10:30 AM EST by telephone and verified that I am speaking with the correct person using two identifiers.   I discussed the limitations, risks, security and privacy concerns of performing an evaluation and management service by telephone and the availability of in person appointments. I also discussed with the patient that there may be a patient responsible charge related to this service. The patient expressed understanding and agreed to proceed.   History of Present Illness: Dyspnea:  MMRC3 = can't walk 100 yards even at a slow pace at a flat grade s stopping due to sob   Cough: better  Sleeping: no longer any resp symptoms  SABA use: no neb  02: none    No obvious day to day or daytime variability or assoc excess/ purulent sputum or mucus plugs or hemoptysis or cp or chest tightness, subjective wheeze or overt sinus or hb symptoms.    Also denies any obvious fluctuation of symptoms with weather or environmental changes or other aggravating or alleviating factors except as outlined above.   Meds reviewed/ med reconciliation completed  Observations/Objective: Looks great/ some mild hoarsness/ good hfa demonstrated on camera  Assessment and Plan: See problem list for active a/p's   Follow Up Instructions: See avs for instructions unique to this ov which includes revised/ updated med list     I discussed the assessment and treatment plan with the patient. The patient was provided an opportunity to ask questions and all were answered. The patient agreed with the plan and demonstrated an understanding of the instructions.   The patient was advised to call back or seek an in-person evaluation if the symptoms worsen or if the condition fails to improve as anticipated.  I provided 20  minutes of non-face-to-face time during this encounter.   Christinia Gully, MD

## 2019-11-24 NOTE — Patient Instructions (Addendum)
Dulera 200 is the same drug Symbiocort 160   Plan A = Automatic = Always=    Symbicort 160 Take 2 puffs first thing in am and then another 2 puffs about 12 hours later.   Work on inhaler technique:  relax and gently blow all the way out then take a nice smooth deep breath back in, triggering the inhaler at same time you start breathing in.  Hold for up to 5 seconds if you can. Blow out thru nose. Rinse and gargle with water when done     Plan B = Backup (to supplement plan A, not to replace it) Only use your albuterol inhaler as a rescue medication to be used if you can't catch your breath by resting or doing a relaxed purse lip breathing pattern.  - The less you use it, the better it will work when you need it. - Ok to use the inhaler up to 2 puffs  every 4 hours if you must but call for appointment if use goes up over your usual need - Don't leave home without it !!  (think of it like the spare tire for your car)   Plan C = Crisis (instead of Plan B but only if Plan B stops working) - only use your albuterol nebulizer if you first try Plan B and it fails to help > ok to use the nebulizer up to every 4 hours but if start needing it regularly call for immediate appointment  Prednisone 10 mg take  4 each am x 2 days,   2 each am x 2 days,  1 each am x 2 days and stop  Continue prilosec Take 30- 60 min before your first and last meals of the day   GERD (REFLUX)  is an extremely common cause of respiratory symptoms just like yours , many times with no obvious heartburn at all.    It can be treated with medication, but also with lifestyle changes including elevation of the head of your bed (ideally with 6 -8inch blocks under the headboard of your bed),  Smoking cessation, avoidance of late meals, excessive alcohol, and avoid fatty foods, chocolate, peppermint, colas, red wine, and acidic juices such as orange juice.  NO MINT OR MENTHOL PRODUCTS SO NO COUGH DROPS  USE SUGARLESS CANDY INSTEAD  (Jolley ranchers or Stover's or Life Savers) or even ice chips will also do - the key is to swallow to prevent all throat clearing. NO OIL BASED VITAMINS - use powdered substitutes.  Avoid fish oil when coughing.    Please schedule a follow up office visit in 4 weeks, sooner if needed  with all medications /inhalers/ solutions in hand so we can verify exactly what you are taking. This includes all medications from all doctors and over the counters

## 2019-11-24 NOTE — Assessment & Plan Note (Addendum)
Onset at birth/ allergy rx by Kozlow up until the age of about 57  - 10/26/2019  After extensive coaching inhaler device,  effectiveness =    90% > start symb 160 2bid  - Allergy profile 10/26/2019 >  Eos 0.3 /  IgE  310 - 11/24/2019  After extensive coaching inhaler device,  effectiveness =    90% but still has #90 from rx from 10/26/19 and no sample given   Improved despite ? Adherence on symbicort 160 2bid  Advised: Advised:  formulary restrictions will be an ongoing challenge for the forseable future and I would be happy to pick an alternative if the pt will first  provide me a list of them -  pt  will need to return here for training for any new device that is required eg dpi vs hfa vs respimat.    In the meantime we can always provide samples so that the patient never runs out of any needed respiratory medications.    F/u in 4 weeks with all meds in hand using a trust but verify approach to confirm accurate Medication  Reconciliation The principal here is that until we are certain that the  patients are doing what we've asked, it makes no sense to ask them to do more.   Each maintenance medication was reviewed in detail including most importantly the difference between maintenance and as needed and under what circumstances the prns are to be used.  Please see AVS for specific  Instructions which are unique to this visit and I personally typed out  which were reviewed in detail over the phone with the patient and a copy provided via MyChart

## 2020-04-30 ENCOUNTER — Ambulatory Visit
Admission: EM | Admit: 2020-04-30 | Discharge: 2020-04-30 | Disposition: A | Discharge status: Home / Self Care | Payer: BC Managed Care – PPO | Attending: Family Medicine | Admitting: Family Medicine

## 2020-04-30 ENCOUNTER — Other Ambulatory Visit: Payer: Self-pay

## 2020-04-30 DIAGNOSIS — U071 COVID-19: Secondary | ICD-10-CM | POA: Diagnosis present

## 2020-04-30 LAB — SARS CORONAVIRUS 2 AG (30 MIN TAT): SARS Coronavirus 2 Ag: POSITIVE — AB

## 2020-04-30 NOTE — ED Provider Notes (Addendum)
[facility]    CSN: [id number] Arrival date & time: 04/30/20  1218      History   Chief Complaint Chief Complaint  Patient presents with  . Fever    HPI Everest Brod is a 34 y.o. male.   34 yo male with a c/o fevers, body aches and dry cough since last night. States his daughter tested positive for covid. They were at [employer] this past Saturday. Patient denies chest pains, shortness of breath, loss of taste or smell.    Fever   Past Medical History:  Diagnosis Date  . Asthma     Patient Active Problem List   Diagnosis Date Noted  . Moderate persistent allergic asthma 10/26/2019    History reviewed. No pertinent surgical history.     Home Medications    Prior to Admission medications   Medication Sig Start Date End Date Taking? Authorizing Provider  acetaminophen (TYLENOL) 650 MG CR tablet Take 650 mg by mouth every 8 (eight) hours as needed for pain.   Yes [provider]  albuterol (PROVENTIL HFA;VENTOLIN HFA) 108 (90 BASE) MCG/ACT inhaler Inhale 2 puffs into the lungs every 6 (six) hours as needed for wheezing or shortness of breath.   Yes [provider]  budesonide-formoterol (SYMBICORT) 160-4.5 MCG/ACT inhaler Take 2 puffs first thing in am and then another 2 puffs about 12 hours later. 10/26/19  Yes Nyoka Cowden, MD  cetirizine (ZYRTEC) 10 MG tablet Take 10 mg by mouth daily.   Yes [provider]  omeprazole (PRILOSEC) 20 MG capsule Take 20 mg by mouth 2 (two) times daily before a meal.   Yes [provider]  benzonatate (TESSALON) 100 MG capsule Take 1 capsule (100 mg total) by mouth every 8 (eight) hours. 10/09/19   Caccavale, Sophia, PA-C  predniSONE (DELTASONE) 10 MG tablet Take  4 each am x 2 days,   2 each am x 2 days,  1 each am x 2 days and stop 11/24/19   Nyoka Cowden, MD    Family History History reviewed. No pertinent family history.  Social History Social History   Tobacco Use  .  Smoking status: Never Smoker  . Smokeless tobacco: Never Used  Vaping Use  . Vaping Use: Never used  Substance Use Topics  . Alcohol use: Yes    Comment: occ  . Drug use: No     Allergies   Patient has no known allergies.   Review of Systems Review of Systems  Constitutional: Positive for fever.     Physical Exam Triage Vital Signs ED Triage Vitals  Enc Vitals Group     BP 04/30/20 1315 (!) 143/89     Pulse Rate 04/30/20 1315 (!) 117     Resp 04/30/20 1315 18     Temp 04/30/20 1315 100.1 F (37.8 C)     Temp Source 04/30/20 1315 Oral     SpO2 04/30/20 1315 98 %     Weight 04/30/20 1319 (!) 265 lb (120.2 kg)     Height 04/30/20 1319 6' (1.829 m)     Head Circumference --      Peak Flow --      Pain Score 04/30/20 1318 7     Pain Loc --      Pain Edu? --      Excl. in GC? --    No data found.  Updated Vital Signs BP (!) 143/89 (BP Location: Left Arm)   Pulse (!) 117  Temp 100.1 F (37.8 C) (Oral)   Resp 18   Ht 6' (1.829 m)   Wt (!) 120.2 kg   SpO2 98%   BMI 35.94 kg/m   Visual Acuity Right Eye Distance:   Left Eye Distance:   Bilateral Distance:    Right Eye Near:   Left Eye Near:    Bilateral Near:     Physical Exam Vitals and nursing note reviewed.  Constitutional:      General: He is not in acute distress.    Appearance: He is not toxic-appearing or diaphoretic.  Cardiovascular:     Rate and Rhythm: Tachycardia present.  Pulmonary:     Effort: Pulmonary effort is normal. No respiratory distress.  Neurological:     Mental Status: He is alert.      UC Treatments / Results  Labs (all labs ordered are listed, but only abnormal results are displayed) Labs Reviewed  SARS CORONAVIRUS 2 AG (30 MIN TAT) - Abnormal; Notable for the following components:      Result Value   SARS Coronavirus 2 Ag POSITIVE (*)    All other components within normal limits    EKG   Radiology No results found.  Procedures Procedures (including  critical care time)  Medications Ordered in UC Medications - No data to display  Initial Impression / Assessment and Plan / UC Course  I have reviewed the triage vital signs and the nursing notes.  Pertinent labs & imaging results that were available during my care of the patient were reviewed by me and considered in my medical decision making (see chart for details).      Final Clinical Impressions(s) / UC Diagnoses   Final diagnoses:  COVID-19    ED Prescriptions    None      1. Lab result and diagnosis reviewed with patient 2. Recommend supportive treatment with rest, fluids, otc medications prn; continue current chronic medications 3. Discussed with patient recommendation to contact his pulmonologist and PCP today to discuss possible arrangements for infusion treatment 4. Go to Emergency Department if symptoms worsen 5. Follow-up prn   PDMP not reviewed this encounter.   Payton Mccallum, MD 04/30/20 1436    Payton Mccallum, MD 04/30/20 1444

## 2020-04-30 NOTE — ED Triage Notes (Signed)
Patient states that he started running a fever last night. Reports that his daughter is positive for covid. Patient states that he had covid previously in January but is concerned that he may have it again due to fever and body aches with dry cough. Reports that they did go to [employer] on Saturday.

## 2020-05-01 ENCOUNTER — Telehealth: Payer: Self-pay | Admitting: Adult Health

## 2020-05-01 NOTE — Telephone Encounter (Signed)
Called and LMOM about patient and covid positivity.  He meets criteria for treatment with monoclonal antibody therapy as he is at increased risk for complications and or hospitalization based on his BMI of 35.  My chart message sent.

## 2020-05-02 ENCOUNTER — Other Ambulatory Visit: Payer: Self-pay | Admitting: Physician Assistant

## 2020-05-02 DIAGNOSIS — U071 COVID-19: Secondary | ICD-10-CM

## 2020-05-02 DIAGNOSIS — J4540 Moderate persistent asthma, uncomplicated: Secondary | ICD-10-CM

## 2020-05-02 MED ORDER — SODIUM CHLORIDE 0.9 % IV SOLN
Freq: Once | INTRAVENOUS | Status: AC
  Filled 2020-05-02: qty 600

## 2020-05-02 NOTE — Progress Notes (Signed)
I connected by phone with Brian Moreno on 05/02/2020 at 9:58 AM to discuss the potential use of a new treatment for mild to moderate COVID-19 viral infection in non-hospitalized patients.  This patient is a 34 y.o. male that meets the FDA criteria for Emergency Use Authorization of COVID monoclonal antibody casirivimab/imdevimab.  Has a (+) direct SARS-CoV-2 viral test result  Has mild or moderate COVID-19   Is NOT hospitalized due to COVID-19  Is within 10 days of symptom onset  Has at least one of the high risk factor(s) for progression to severe COVID-19 and/or hospitalization as defined in EUA.  Specific high risk criteria : BMI > 25, asthma    I have spoken and communicated the following to the patient or parent/caregiver regarding COVID monoclonal antibody treatment:  1. FDA has authorized the emergency use for the treatment of mild to moderate COVID-19 in adults and pediatric patients with positive results of direct SARS-CoV-2 viral testing who are 42 years of age and older weighing at least 40 kg, and who are at high risk for progressing to severe COVID-19 and/or hospitalization.  2. The significant known and potential risks and benefits of COVID monoclonal antibody, and the extent to which such potential risks and benefits are unknown.  3. Information on available alternative treatments and the risks and benefits of those alternatives, including clinical trials.  4. Patients treated with COVID monoclonal antibody should continue to self-isolate and use infection control measures (e.g., wear mask, isolate, social distance, avoid sharing personal items, clean and disinfect "high touch" surfaces, and frequent handwashing) according to CDC guidelines.   5. The patient or parent/caregiver has the option to accept or refuse COVID monoclonal antibody treatment.  After reviewing this information with the patient, The patient agreed to proceed with receiving casirivimab\imdevimab  infusion and will be provided a copy of the Fact sheet prior to receiving the infusion.   Sx onset 7/25. Directions given to [facility]. Discussed infusion charge to be billed to medical insurance.   Cline Crock 05/02/2020 9:58 AM

## 2020-05-03 ENCOUNTER — Ambulatory Visit (HOSPITAL_COMMUNITY)
Admission: RE | Admit: 2020-05-03 | Discharge: 2020-05-03 | Disposition: A | Discharge status: Home / Self Care | Payer: BC Managed Care – PPO | Source: Ambulatory Visit | Attending: Pulmonary Disease | Admitting: Pulmonary Disease

## 2020-05-03 DIAGNOSIS — U071 COVID-19: Secondary | ICD-10-CM | POA: Insufficient documentation

## 2020-05-03 DIAGNOSIS — J4540 Moderate persistent asthma, uncomplicated: Secondary | ICD-10-CM | POA: Insufficient documentation

## 2020-05-03 MED ORDER — METHYLPREDNISOLONE SODIUM SUCC 125 MG IJ SOLR: 125.0000 mg | Freq: Once | INTRAMUSCULAR | Status: DC | PRN

## 2020-05-03 MED ORDER — EPINEPHRINE 0.3 MG/0.3ML IJ SOAJ: 0.3000 mg | Freq: Once | INTRAMUSCULAR | Status: DC | PRN

## 2020-05-03 MED ORDER — ALBUTEROL SULFATE HFA 108 (90 BASE) MCG/ACT IN AERS: 2.0000 | INHALATION_SPRAY | Freq: Once | RESPIRATORY_TRACT | Status: DC | PRN

## 2020-05-03 MED ORDER — DIPHENHYDRAMINE HCL 50 MG/ML IJ SOLN: 50.0000 mg | Freq: Once | INTRAMUSCULAR | Status: DC | PRN

## 2020-05-03 MED ORDER — SODIUM CHLORIDE 0.9 % IV SOLN: INTRAVENOUS | Status: DC | PRN

## 2020-05-03 MED ORDER — FAMOTIDINE IN NACL 20-0.9 MG/50ML-% IV SOLN: 20.0000 mg | Freq: Once | INTRAVENOUS | Status: DC | PRN

## 2020-05-03 NOTE — Discharge Instructions (Signed)

## 2020-05-03 NOTE — Progress Notes (Signed)
°  Diagnosis: COVID-19 ° °Physician:Dr Wright ° °Procedure: [facility] Med: casirivimab\imdevimab infusion - Provided patient with casirivimab\imdevimab fact sheet for patients, parents and caregivers prior to infusion. ° °Complications: No immediate complications noted. ° °Discharge: Discharged home  ° °[name] °05/03/2020 ° °

## 2020-07-13 DIAGNOSIS — Z2821 Immunization not carried out because of patient refusal: Secondary | ICD-10-CM | POA: Insufficient documentation

## 2020-07-16 DIAGNOSIS — E785 Hyperlipidemia, unspecified: Secondary | ICD-10-CM | POA: Insufficient documentation

## 2020-09-10 DIAGNOSIS — K219 Gastro-esophageal reflux disease without esophagitis: Secondary | ICD-10-CM | POA: Insufficient documentation

## 2020-11-17 IMAGING — DX DG CHEST 1V PORT
1 series · 1 of 1 positions shown · non-contrast
Comparison: 11/25/2015

CLINICAL DATA: Possible COVID, shortness of breath, cough

EXAM:
PORTABLE CHEST 1 VIEW

[chest]
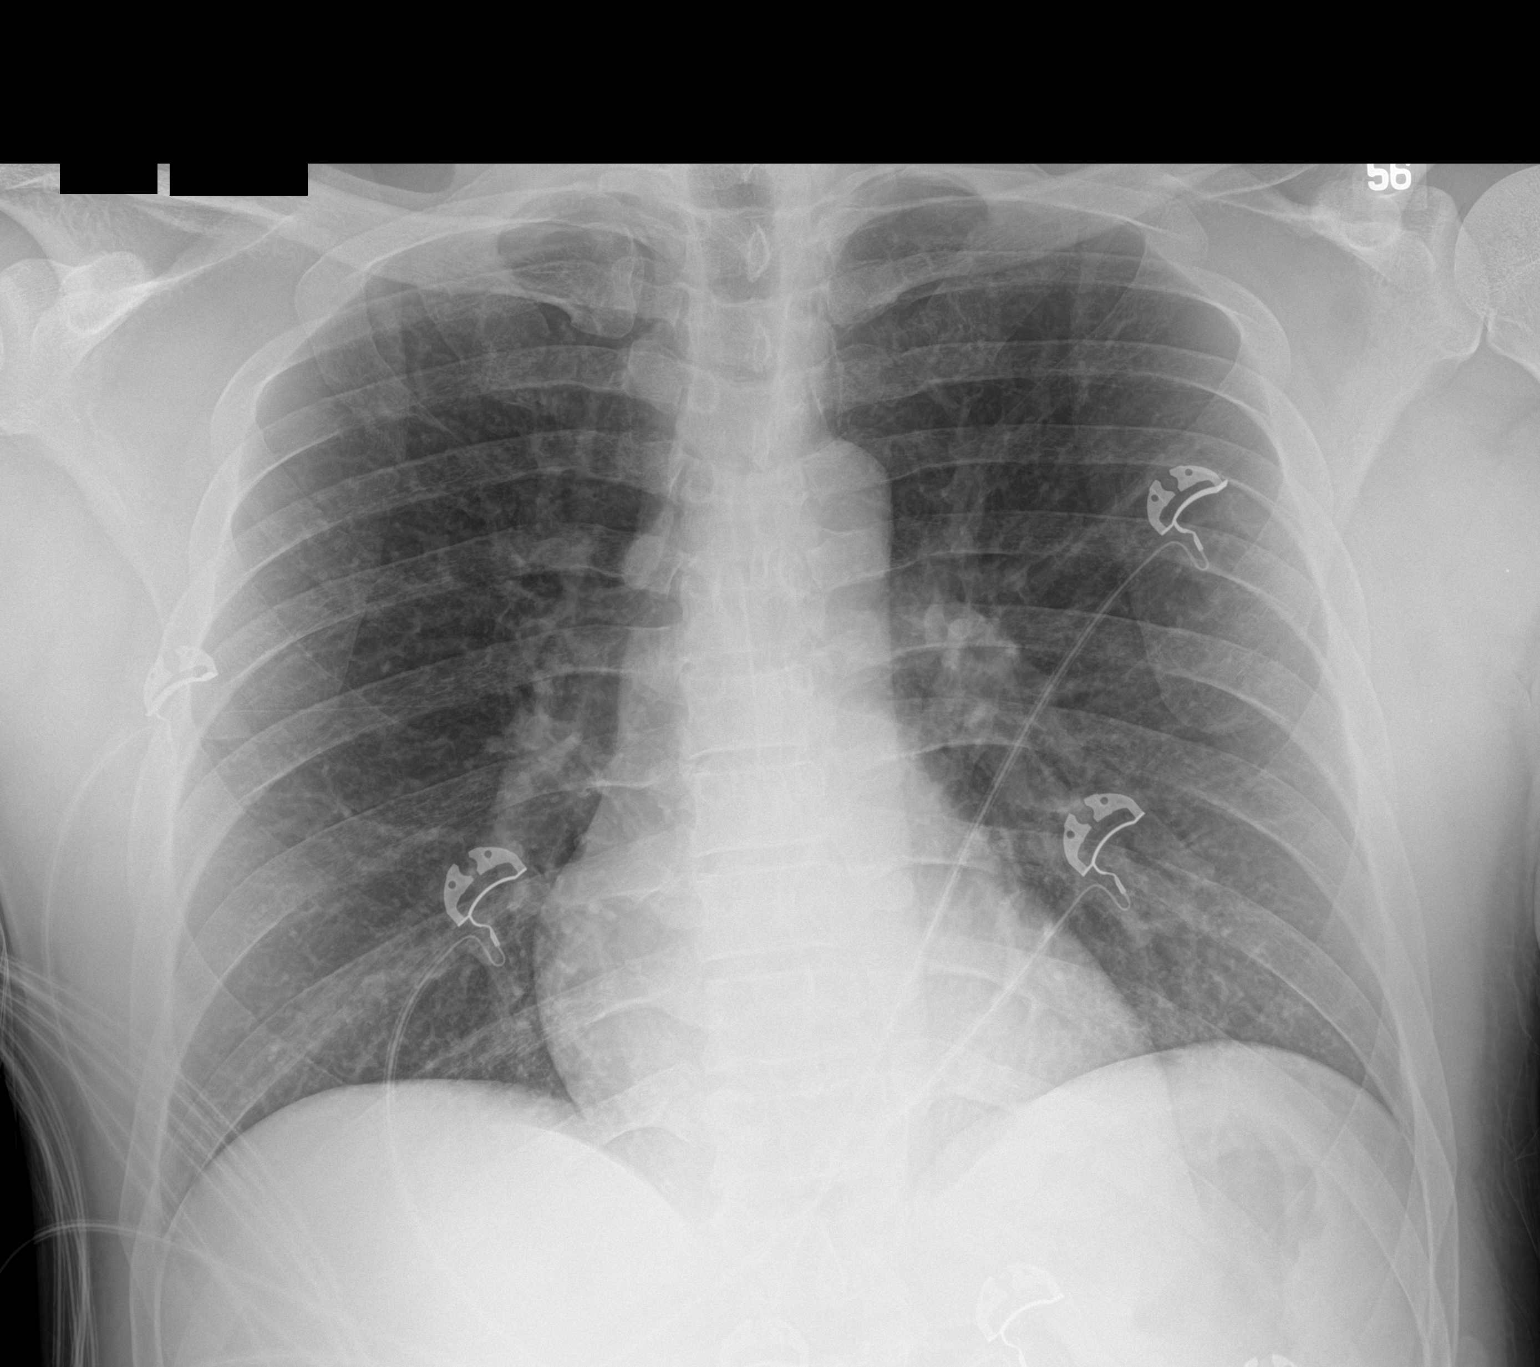

[1 of 1 positions shown; findings below may reference images not displayed]

FINDINGS: The heart size and mediastinal contours are within normal limits.
Both lungs are clear. The visualized skeletal structures are
unremarkable.
IMPRESSION: No acute abnormality of the lungs in AP portable projection.

## 2021-01-17 ENCOUNTER — Emergency Department (HOSPITAL_COMMUNITY)
Admission: EM | Admit: 2021-01-17 | Discharge: 2021-01-18 | Disposition: A | Discharge status: Home / Self Care | Payer: 59 | Attending: Emergency Medicine | Admitting: Emergency Medicine

## 2021-01-17 ENCOUNTER — Other Ambulatory Visit: Payer: Self-pay

## 2021-01-17 ENCOUNTER — Emergency Department (HOSPITAL_COMMUNITY): Payer: 59

## 2021-01-17 DIAGNOSIS — J4540 Moderate persistent asthma, uncomplicated: Secondary | ICD-10-CM | POA: Insufficient documentation

## 2021-01-17 DIAGNOSIS — U071 COVID-19: Secondary | ICD-10-CM | POA: Insufficient documentation

## 2021-01-17 DIAGNOSIS — R0602 Shortness of breath: Secondary | ICD-10-CM | POA: Diagnosis present

## 2021-01-17 DIAGNOSIS — Z7951 Long term (current) use of inhaled steroids: Secondary | ICD-10-CM | POA: Insufficient documentation

## 2021-01-17 LAB — BASIC METABOLIC PANEL
Anion gap: 10 (ref 5–15)
BUN: 11 mg/dL (ref 6–20)
CO2: 24 mmol/L (ref 22–32)
Calcium: 9.3 mg/dL (ref 8.9–10.3)
Chloride: 103 mmol/L (ref 98–111)
Creatinine, Ser: 1.21 mg/dL (ref 0.61–1.24)
GFR, Estimated: >60 mL/min (ref >60)
Glucose, Bld: 104 mg/dL — ABNORMAL HIGH (ref 70–99)
Potassium: 3.5 mmol/L (ref 3.5–5.1)
Sodium: 137 mmol/L (ref 135–145)

## 2021-01-17 LAB — CBC WITH DIFFERENTIAL/PLATELET
Abs Immature Granulocytes: 0.01 10*3/uL (ref 0.00–0.07)
Basophils Absolute: 0.1 10*3/uL (ref 0.0–0.1)
Basophils Relative: 1 %
Eosinophils Absolute: 0 10*3/uL (ref 0.0–0.5)
Eosinophils Relative: 0 %
HCT: 49.9 % (ref 39.0–52.0)
Hemoglobin: 16.6 g/dL (ref 13.0–17.0)
Immature Granulocytes: 0 %
Lymphocytes Relative: 30 %
Lymphs Abs: 1.4 10*3/uL (ref 0.7–4.0)
MCH: 31.1 pg (ref 26.0–34.0)
MCHC: 33.3 g/dL (ref 30.0–36.0)
MCV: 93.4 fL (ref 80.0–100.0)
Monocytes Absolute: 0.8 10*3/uL (ref 0.1–1.0)
Monocytes Relative: 16 %
Neutro Abs: 2.4 10*3/uL (ref 1.7–7.7)
Neutrophils Relative %: 53 %
Platelets: 215 10*3/uL (ref 150–400)
RBC: 5.34 MIL/uL (ref 4.22–5.81)
RDW: 11.8 % (ref 11.5–15.5)
WBC: 4.7 10*3/uL (ref 4.0–10.5)
nRBC: 0 % (ref 0.0–0.2)

## 2021-01-17 LAB — TROPONIN I (HIGH SENSITIVITY): Troponin I (High Sensitivity): 5 ng/L (ref <18)

## 2021-01-17 NOTE — ED Triage Notes (Signed)
Patient reports not feeling well the last few days, also reports some shortness of breath and chest pain. Denies any sick contacts, states hx of asthma

## 2021-01-17 NOTE — ED Triage Notes (Signed)
Emergency Medicine Provider Triage Evaluation Note  Brian Moreno , a 35 y.o. male  was evaluated in triage.  Pt complains of shortness of breath, chest pain at rest and with movement, malaise, fevers, cough  Review of Systems  Positive: As above Negative: Abdominal pain, nausea, vomiting, hemoptysis, leg swelling  Physical Exam  BP 130/83 (BP Location: Right Arm)   Pulse 96   Temp 100.1 F (37.8 C) (Oral)   Resp 16   Ht 6' (1.829 m)   Wt 98.4 kg   SpO2 95%   BMI 29.43 kg/m  Gen:   Awake, no distress   HEENT:  Atraumatic  Resp:  Normal effort  Cardiac:  Normal rate  Abd:   Nondistended, nontender  MSK:   Moves extremities without difficulty Neuro:  Speech clear   Medical Decision Making  Medically screening exam initiated at 7:54 PM.  Appropriate orders placed.  Brian Moreno was informed that the remainder of the evaluation will be completed by another provider, this initial triage assessment does not replace that evaluation, and the importance of remaining in the ED until their evaluation is complete.  Clinical Impression  35 year old male who presents with complaints of chest pain, cough, shortness of breath, malaise, fatigue.  Borderline fever here.  He does endorse chest pain even at rest.  Plan for basic labs, Covid, chest x-ray, EKG and troponin.  Vitals reassuring.  Stable for further evaluation.   Mare Ferrari, PA-C 01/17/21 [phone]

## 2021-01-18 LAB — RESP PANEL BY RT-PCR (FLU A&B, COVID) ARPGX2
Influenza A by PCR: NEGATIVE
Influenza B by PCR: NEGATIVE
SARS Coronavirus 2 by RT PCR: POSITIVE — AB

## 2021-01-18 MED ORDER — ALBUTEROL SULFATE HFA 108 (90 BASE) MCG/ACT IN AERS
2.0000 | INHALATION_SPRAY | Freq: Once | RESPIRATORY_TRACT | Status: AC
  Administered 2021-01-18: 2 via RESPIRATORY_TRACT
  Filled 2021-01-18: qty 6.7

## 2021-01-18 NOTE — ED Provider Notes (Signed)
[facility] EMERGENCY DEPARTMENT Provider Note   CSN: [id number] Arrival date & time: 01/17/21  1921     History Chief Complaint  Patient presents with  . Shortness of Breath  . Fever    Brian Moreno is a 35 y.o. male.  The history is provided by the patient.  Shortness of Breath Severity:  Moderate Onset quality:  Gradual Duration:  1 day Timing:  Intermittent Progression:  Worsening Chronicity:  New Relieved by:  Nothing Worsened by:  Nothing Associated symptoms: chest pain, cough, fever, headaches and wheezing   Associated symptoms: no abdominal pain and no vomiting   Risk factors: no tobacco use   Fever Associated symptoms: chest pain, cough, headaches and myalgias   Associated symptoms: no vomiting    Patient with history of asthma presents with fever, cough over the past day.  He reports mild shortness of breath and chest tightness.  He is a non-smoker.  He reports that he feels that his asthma is worsening   Patient reports headache with coughing Past Medical History:  Diagnosis Date  . Asthma     Patient Active Problem List   Diagnosis Date Noted  . Moderate persistent allergic asthma 10/26/2019    No past surgical history on file.     No family history on file.  Social History   Tobacco Use  . Smoking status: Never Smoker  . Smokeless tobacco: Never Used  Vaping Use  . Vaping Use: Never used  Substance Use Topics  . Alcohol use: Yes    Comment: occ  . Drug use: No    Home Medications Prior to Admission medications   Medication Sig Start Date End Date Taking? Authorizing Provider  acetaminophen (TYLENOL) 650 MG CR tablet Take 650 mg by mouth every 8 (eight) hours as needed for pain.    [provider]  albuterol (PROVENTIL HFA;VENTOLIN HFA) 108 (90 BASE) MCG/ACT inhaler Inhale 2 puffs into the lungs every 6 (six) hours as needed for wheezing or shortness of breath.    [provider]  benzonatate  (TESSALON) 100 MG capsule Take 1 capsule (100 mg total) by mouth every 8 (eight) hours. 10/09/19   Caccavale, Sophia, PA-C  budesonide-formoterol (SYMBICORT) 160-4.5 MCG/ACT inhaler Take 2 puffs first thing in am and then another 2 puffs about 12 hours later. 10/26/19   Nyoka Cowden, MD  cetirizine (ZYRTEC) 10 MG tablet Take 10 mg by mouth daily.    [provider]  omeprazole (PRILOSEC) 20 MG capsule Take 20 mg by mouth 2 (two) times daily before a meal.    [provider]  predniSONE (DELTASONE) 10 MG tablet Take  4 each am x 2 days,   2 each am x 2 days,  1 each am x 2 days and stop 11/24/19   Nyoka Cowden, MD    Allergies    Patient has no known allergies.  Review of Systems   Review of Systems  Constitutional: Positive for fatigue and fever.  Respiratory: Positive for cough, shortness of breath and wheezing.   Cardiovascular: Positive for chest pain.  Gastrointestinal: Negative for abdominal pain and vomiting.  Musculoskeletal: Positive for myalgias.  Neurological: Positive for headaches.  All other systems reviewed and are negative.   Physical Exam Updated Vital Signs BP 107/87   Pulse 77   Temp 98.8 F (37.1 C) (Oral)   Resp 18   Ht 1.829 m (6')   Wt 98.4 kg   SpO2 94%  BMI 29.43 kg/m   Physical Exam  CONSTITUTIONAL: Well developed/well nourished, no distress HEAD: Normocephalic/atraumatic EYES: EOMI/PERRL ENMT: Mucous membranes moist NECK: supple no meningeal signs SPINE/BACK:entire spine nontender CV: S1/S2 noted, no murmurs/rubs/gallops noted LUNGS: Decreased breath sounds noted, wheezing bilaterally ABDOMEN: soft, nontender, no rebound or guarding, bowel sounds noted throughout abdomen GU:no cva tenderness NEURO: Pt is awake/alert/appropriate, moves all extremitiesx4.  No facial droop.   EXTREMITIES: pulses normal/equal, full ROM SKIN: warm, color normal PSYCH: no abnormalities of mood noted, alert and oriented to situation  ED Results  / Procedures / Treatments   Labs (all labs ordered are listed, but only abnormal results are displayed) Labs Reviewed  RESP PANEL BY RT-PCR (FLU A&B, COVID) ARPGX2 - Abnormal; Notable for the following components:      Result Value   SARS Coronavirus 2 by RT PCR POSITIVE (*)    All other components within normal limits  BASIC METABOLIC PANEL - Abnormal; Notable for the following components:   Glucose, Bld 104 (*)    All other components within normal limits  CBC WITH DIFFERENTIAL/PLATELET  TROPONIN I (HIGH SENSITIVITY)    EKG EKG Interpretation  Date/Time:  Thursday January 17 2021 20:19:13 EDT Ventricular Rate:  94 PR Interval:  148 QRS Duration: 76 QT Interval:  336 QTC Calculation: 420 R Axis:   75 Text Interpretation: Normal sinus rhythm Nonspecific T wave abnormality Abnormal ECG Confirmed by Zadie Rhine ([id number]) on 01/18/2021 12:21:19 AM   Radiology DG Chest Portable 1 View  Result Date: 01/17/2021 CLINICAL DATA:  Chest pain EXAM: PORTABLE CHEST 1 VIEW COMPARISON:  10/09/2019 FINDINGS: The heart size and mediastinal contours are within normal limits. Both lungs are clear. The visualized skeletal structures are unremarkable. IMPRESSION: No acute abnormality of the lungs in AP portable projection. Electronically Signed   By: Lauralyn Primes M.D.   On: 01/17/2021 21:04    Procedures Procedures   Medications Ordered in ED Medications  albuterol (VENTOLIN HFA) 108 (90 Base) MCG/ACT inhaler 2 puff (2 puffs Inhalation Given 01/18/21 0051)    ED Course  I have reviewed the triage vital signs and the nursing notes.  Pertinent labs & imaging results that were available during my care of the patient were reviewed by me and considered in my medical decision making (see chart for details).    MDM Rules/Calculators/A&P                          12:48 AM Patient presents with fever, cough and shortness of breath over the past day.  Suspect viral illness, either influenza versus  COVID-19 Will treat wheezing and reassess.  Labs are pending at this time 2:39 AM Patient positive for COVID-19.  He is in no acute distress.  Room air pulse ox 98% and CXR is negative.  Advise continue albuterol at home. We discussed return precautions  Brian Moreno was evaluated in Emergency Department on 01/18/2021 for the symptoms described in the history of present illness. He was evaluated in the context of the global COVID-19 pandemic, which necessitated consideration that the patient might be at risk for infection with the SARS-CoV-2 virus that causes COVID-19. Institutional protocols and algorithms that pertain to the evaluation of patients at risk for COVID-19 are in a state of rapid change based on information released by regulatory bodies including the CDC and federal and state organizations. These policies and algorithms were followed during the patient's care in the ED.  Final Clinical Impression(s) /  ED Diagnoses Final diagnoses:  COVID-19    Rx / DC Orders ED Discharge Orders    None       Zadie Rhine, MD 01/18/21 [phone]

## 2021-01-18 NOTE — ED Notes (Signed)
Discharge instructions discussed with pt. Pt verbalized understanding. Pt stable and ambulatory. No signature pad available. 

## 2021-01-19 ENCOUNTER — Encounter: Payer: Self-pay | Admitting: Infectious Diseases

## 2021-01-19 ENCOUNTER — Telehealth: Payer: Self-pay | Admitting: Infectious Diseases

## 2021-01-19 NOTE — Telephone Encounter (Signed)
Called to discuss with patient about COVID-19 symptoms and the use of one of the available treatments for those with mild to moderate Covid symptoms and at a high risk of hospitalization.  Pt appears to qualify for outpatient treatment due to co-morbid conditions and/or a member of an at-risk group in accordance with the FDA Emergency Use Authorization.    Symptom onset: 4/13 Vaccinated: unknown Booster?  Immunocompromised? no Qualifiers: BMI 29, moderate persistent asthma   Unable to reach pt - left him a [employer] as his phone did not connect to a voicemail.    Rexene Alberts

## 2021-03-08 ENCOUNTER — Ambulatory Visit (INDEPENDENT_AMBULATORY_CARE_PROVIDER_SITE_OTHER): Payer: 59

## 2021-03-08 ENCOUNTER — Ambulatory Visit
Admission: EM | Admit: 2021-03-08 | Discharge: 2021-03-08 | Disposition: A | Discharge status: Home / Self Care | Payer: 59 | Attending: Emergency Medicine | Admitting: Emergency Medicine

## 2021-03-08 ENCOUNTER — Other Ambulatory Visit: Payer: Self-pay

## 2021-03-08 ENCOUNTER — Ambulatory Visit
Admission: EM | Admit: 2021-03-08 | Discharge: 2021-03-08 | Disposition: A | Discharge status: Home / Self Care | Payer: 59

## 2021-03-08 DIAGNOSIS — R079 Chest pain, unspecified: Secondary | ICD-10-CM

## 2021-03-08 DIAGNOSIS — R0789 Other chest pain: Secondary | ICD-10-CM

## 2021-03-08 LAB — TROPONIN I (HIGH SENSITIVITY): Troponin I (High Sensitivity): 2 ng/L (ref <18)

## 2021-03-08 MED ORDER — PREDNISONE 10 MG (21) PO TBPK: ORAL_TABLET | ORAL | 0 refills | Status: DC

## 2021-03-08 NOTE — ED Triage Notes (Signed)
Pt c/o mid-sternal chest pains since this past Sunday. Pt reports sharp pain with activity. Pt denies any known cardiac history. Pt denies any known injuries. Pt denies any radiating pain.

## 2021-03-08 NOTE — ED Provider Notes (Signed)
[facility]    CSN: [id number] Arrival date & time: 03/08/21  1920      History   Chief Complaint Chief Complaint  Patient presents with  . Chest Pain    HPI Brian Moreno is a 35 y.o. male.   HPI   35 year old male here for evaluation of chest pain.  Patient reports that he has had left-sided chest pain for the last week that radiates across his chest to the right.  The pain increases with movement and he is currently rating it as a 7/10.  He is also has some associated night sweats.  He denies shortness of breath, nausea, radiation to his arm or neck, dizziness, or passing out.  Past Medical History:  Diagnosis Date  . Asthma     Patient Active Problem List   Diagnosis Date Noted  . Gastroesophageal reflux disease without esophagitis 09/10/2020  . Hyperlipidemia LDL goal <130 07/16/2020  . Pneumococcal vaccine refused 07/13/2020  . Moderate persistent asthma, uncomplicated 02/18/2018  . Non-seasonal allergic rhinitis 02/18/2018    History reviewed. No pertinent surgical history.     Home Medications    Prior to Admission medications   Medication Sig Start Date End Date Taking? Authorizing Provider  albuterol (PROVENTIL HFA;VENTOLIN HFA) 108 (90 BASE) MCG/ACT inhaler Inhale 2 puffs into the lungs every 6 (six) hours as needed for wheezing or shortness of breath.   Yes [provider]  cetirizine (ZYRTEC) 10 MG tablet Take 10 mg by mouth daily.   Yes [provider]  Dupilumab 300 MG/2ML SOPN Inject 2 mLs into the skin every 14 (fourteen) days. 07/24/20  Yes [provider]  Fluticasone-Umeclidin-Vilant (TRELEGY ELLIPTA) 200-62.5-25 MCG/INH AEPB Inhale 1 puff into the lungs daily. 01/16/20  Yes [provider]  meloxicam (MOBIC) 15 MG tablet Take 1 tablet by mouth daily as needed. 01/03/21  Yes [provider]  montelukast (SINGULAIR) 10 MG tablet Take by mouth. 01/16/20  Yes [provider]   omeprazole (PRILOSEC) 20 MG capsule Take 20 mg by mouth 2 (two) times daily before a meal.   Yes [provider]  predniSONE (STERAPRED UNI-PAK 21 TAB) 10 MG (21) TBPK tablet Take 6 tablets on day 1, 5 tablets day 2, 4 tablets day 3, 3 tablets day 4, 2 tablets day 5, 1 tablet day 6 03/08/21  Yes Becky Augusta, NP  rosuvastatin (CRESTOR) 40 MG tablet Take 1 tablet by mouth daily. 07/16/20  Yes [provider]  acetaminophen (TYLENOL) 650 MG CR tablet Take 650 mg by mouth every 8 (eight) hours as needed for pain.    [provider]    Family History History reviewed. No pertinent family history.  Social History Social History   Tobacco Use  . Smoking status: Never Smoker  . Smokeless tobacco: Never Used  Vaping Use  . Vaping Use: Never used  Substance Use Topics  . Alcohol use: Yes    Comment: occ  . Drug use: No     Allergies   Other and Amoxapine and related   Review of Systems Review of Systems  Constitutional: Negative for activity change, appetite change, diaphoresis and fever.  Respiratory: Negative for cough and shortness of breath.   Cardiovascular: Positive for chest pain.  Gastrointestinal: Negative for nausea.  Skin: Negative.   Neurological: Negative for dizziness and syncope.  Hematological: Negative.   Psychiatric/Behavioral: Negative.      Physical Exam Triage Vital Signs ED Triage Vitals  Enc Vitals Group  BP 03/08/21 1938 108/80     Pulse Rate 03/08/21 1938 76     Resp 03/08/21 1938 18     Temp 03/08/21 1938 98.7 F (37.1 C)     Temp Source 03/08/21 1938 Oral     SpO2 03/08/21 1938 96 %     Weight 03/08/21 1934 217 lb (98.4 kg)     Height 03/08/21 1934 6' (1.829 m)     Head Circumference --      Peak Flow --      Pain Score 03/08/21 1934 7     Pain Loc --      Pain Edu? --      Excl. in GC? --    No data found.  Updated Vital Signs BP 108/80 (BP Location: Left Arm)   Pulse 76   Temp 98.7 F (37.1 C) (Oral)    Resp 18   Ht 6' (1.829 m)   Wt 217 lb (98.4 kg)   SpO2 96%   BMI 29.43 kg/m   Visual Acuity Right Eye Distance:   Left Eye Distance:   Bilateral Distance:    Right Eye Near:   Left Eye Near:    Bilateral Near:     Physical Exam Vitals and nursing note reviewed.  Constitutional:      General: He is not in acute distress.    Appearance: He is normal weight.  HENT:     Head: Normocephalic and atraumatic.  Cardiovascular:     Rate and Rhythm: Normal rate and regular rhythm.     Pulses: Normal pulses.     Heart sounds: Normal heart sounds. No murmur heard. No gallop.   Pulmonary:     Effort: Pulmonary effort is normal.     Breath sounds: Normal breath sounds. No wheezing, rhonchi or rales.  Musculoskeletal:        General: No tenderness.  Skin:    General: Skin is warm and dry.     Capillary Refill: Capillary refill takes less than 2 seconds.     Findings: No erythema or rash.  Neurological:     General: No focal deficit present.     Mental Status: He is alert and oriented to person, place, and time.  Psychiatric:        Mood and Affect: Mood normal.        Behavior: Behavior normal.        Thought Content: Thought content normal.        Judgment: Judgment normal.      UC Treatments / Results  Labs (all labs ordered are listed, but only abnormal results are displayed) Labs Reviewed  TROPONIN I (HIGH SENSITIVITY)    EKG   Radiology DG Chest 2 View  Result Date: 03/08/2021 CLINICAL DATA:  Chest pain EXAM: CHEST - 2 VIEW COMPARISON:  January 17, 2021 FINDINGS: The lungs are clear. The heart size and pulmonary vascularity are normal. No adenopathy. No bone lesions. No pneumothorax. IMPRESSION: Lungs clear.  Cardiac silhouette normal. Electronically Signed   By: Bretta Bang III M.D.   On: 03/08/2021 20:09    Procedures Procedures (including critical care time)  Medications Ordered in UC Medications - No data to display  Initial Impression / Assessment  and Plan / UC Course  I have reviewed the triage vital signs and the nursing notes.  Pertinent labs & imaging results that were available during my care of the patient were reviewed by me and considered in my medical decision making (see  chart for details).   35 year old male here for evaluation of chest pain that started 5 days ago.  He reports that the pains starts on the left side of his chest and radiates across to the right side of his chest.  The pain increases with movement but not with deep breathing.  Patient is a cough, shortness breath, nausea and the pain does not radiate to his arm or neck.  Patient reports his pain is currently 7 out of 10 is also had some associated night sweats.  Patient reports that today he mowed his mom's lawn with a push mower and the pain increased so he came in to be evaluated.  Physical exam reveals a benign cardiopulmonary exam.  No tenderness to the chest wall with palpation.  EKG collected at triage shows normal sinus rhythm with a ventricular rate of 69 bpm, PR interval of 168 ms, QRS duration of 88 ms, QT of 374 ms, and QTc of 400 ms.  EKG is normal axis.  EKG compared to January 17, 2021 without significant change.  Will check troponin and chest x-ray.  Chest x-ray reviewed and independently evaluated by me.  Interpretation: Normal chest x-ray.  Awaiting radiology overread. Radiology interpretation of chest x-ray is that is a negative exam.  Troponin level is 2.  Will discharge patient home with a diagnosis of musculoskeletal chest wall pain and treat with prednisone as patient is taking meloxicam.   Final Clinical Impressions(s) / UC Diagnoses   Final diagnoses:  Chest wall pain     Discharge Instructions     Starting tomorrow morning take the prednisone according to the package instructions.  You will take it each morning at breakfast time for the next 6 days to decrease the inflammation causing pain.  Apply ice or moist heat to your chest  wall for 20 minutes at a time 2-3 times a day to also facilitate pain relief.  Follow-up with your primary care provider for any new or worsening symptoms.    ED Prescriptions    Medication Sig Dispense Auth. Provider   predniSONE (STERAPRED UNI-PAK 21 TAB) 10 MG (21) TBPK tablet Take 6 tablets on day 1, 5 tablets day 2, 4 tablets day 3, 3 tablets day 4, 2 tablets day 5, 1 tablet day 6 21 tablet Becky Augusta, NP     PDMP not reviewed this encounter.   Becky Augusta, NP 03/08/21 2034

## 2021-03-08 NOTE — Discharge Instructions (Addendum)
Starting tomorrow morning take the prednisone according to the package instructions.  You will take it each morning at breakfast time for the next 6 days to decrease the inflammation causing pain.  Apply ice or moist heat to your chest wall for 20 minutes at a time 2-3 times a day to also facilitate pain relief.  Follow-up with your primary care provider for any new or worsening symptoms.

## 2021-06-25 DIAGNOSIS — R0789 Other chest pain: Secondary | ICD-10-CM | POA: Insufficient documentation

## 2022-03-16 ENCOUNTER — Other Ambulatory Visit: Payer: Self-pay

## 2022-03-16 ENCOUNTER — Ambulatory Visit
Admission: EM | Admit: 2022-03-16 | Discharge: 2022-03-16 | Disposition: A | Discharge status: Home / Self Care | Payer: 59 | Attending: Emergency Medicine | Admitting: Emergency Medicine

## 2022-03-16 ENCOUNTER — Encounter: Payer: Self-pay | Admitting: Emergency Medicine

## 2022-03-16 DIAGNOSIS — J020 Streptococcal pharyngitis: Secondary | ICD-10-CM

## 2022-03-16 LAB — POCT RAPID STREP A (OFFICE): Rapid Strep A Screen: POSITIVE — AB

## 2022-03-16 MED ORDER — AMOXICILLIN 500 MG PO CAPS: 1000.0000 mg | ORAL_CAPSULE | Freq: Every day | ORAL | 0 refills | Status: DC

## 2022-03-16 MED ORDER — CEFDINIR 300 MG PO CAPS: 300.0000 mg | ORAL_CAPSULE | Freq: Two times a day (BID) | ORAL | 0 refills | Status: AC

## 2022-03-16 MED ORDER — ACETAMINOPHEN 325 MG PO TABS
650.0000 mg | ORAL_TABLET | Freq: Once | ORAL | Status: AC
  Administered 2022-03-16: 650 mg via ORAL

## 2022-03-16 NOTE — Discharge Instructions (Addendum)
Your strep test today is positive.  I recommend that you begin antibiotics now for treatment.  I have sent a prescription to your pharmacy.  Please take them as prescribed.  After 24 hours of antibiotics, please discard your toothbrush as well as any other oral devices that you are currently using and replace them with new ones to avoid reinfection.  You should begin to feel better in 24 to 48 hours.   Once you have been on antibiotics for a full 24 hours, you are no longer considered contagious.   I have provided you with a note to return to work.   Even if you are feeling better, please make sure that you finish the full 10-day course and do not skip any doses.  Failure to complete a full course of antibiotics for strep throat can result in worsening infection that may require longer treatment with stronger antibiotics.   Please see the list below for recommended medications, dosages and frequencies to provide relief of your current symptoms:     Omnicef (cefdinir):  1 capsule twice daily for 10 days, you can take it with or without food.  This antibiotic can cause upset stomach, this will resolve once antibiotics are complete.  You are welcome to use a probiotic, eat yogurt, take Imodium while taking this medication.  Please avoid other systemic medications such as Maalox, Pepto-Bismol or milk of magnesia as they can interfere with your body's ability to absorb the antibiotics.  Advil, Motrin (ibuprofen): This is a good anti-inflammatory medication which addresses aches, pains and inflammation of the upper airways that causes sinus and nasal congestion as well as in the lower airways which makes your cough feel tight and sometimes burn.  I recommend that you take between 400 to 600 mg every 6-8 hours as needed.      Tylenol (acetaminophen): This is a good fever reducer.  If your body temperature rises above 101.5 as measured with a thermometer, it is recommended that you take 1,000 mg every 8 hours  until your temperature falls below 101.5, please not take more than 3,000 mg of acetaminophen either as a separate medication or as in ingredient in an over-the-counter cold/flu preparation within a 24-hour period.      Please follow-up within the next 7-10 days either with your primary care provider or urgent care if your symptoms do not resolve.  If you do not have a primary care provider, we will assist you in finding one.   Thank you for visiting urgent care today.  We appreciate the opportunity to participate in your care.

## 2022-03-16 NOTE — ED Triage Notes (Signed)
Pt here for sore throat and fever with body aches and HA x 3 days

## 2022-03-16 NOTE — ED Provider Notes (Signed)
[facility]    CSN: [id number] Arrival date & time: 03/16/22  1223    HISTORY   Chief Complaint  Patient presents with   Fever   HPI Brian Moreno is a 36 y.o. male. Patient presents to urgent care with a 3-day history of sore throat, fever, body ache and headache.  Patient states he denies known sick contacts.  States he has been taking Tylenol and ibuprofen for his symptoms but is not feeling any better.  Patient reports pain with swallowing and has noticed some white patches on the back of his tonsils.  Of note, rapid strep test today is positive.  Patient had a temperature of 103 on arrival, patient was provided with Tylenol 650 mg shortly after triage.  The history is provided by the patient.   Past Medical History:  Diagnosis Date   Asthma    Patient Active Problem List   Diagnosis Date Noted   Gastroesophageal reflux disease without esophagitis 09/10/2020   Hyperlipidemia LDL goal <130 07/16/2020   Pneumococcal vaccine refused 07/13/2020   Moderate persistent asthma, uncomplicated 02/18/2018   Non-seasonal allergic rhinitis 02/18/2018   History reviewed. No pertinent surgical history.  Home Medications    Prior to Admission medications   Medication Sig Start Date End Date Taking? Authorizing Provider  cefdinir (OMNICEF) 300 MG capsule Take 1 capsule (300 mg total) by mouth 2 (two) times daily for 10 days. 03/16/22 03/26/22 Yes Theadora Rama Scales, PA-C  acetaminophen (TYLENOL) 650 MG CR tablet Take 650 mg by mouth every 8 (eight) hours as needed for pain.    [provider]  albuterol (PROVENTIL HFA;VENTOLIN HFA) 108 (90 BASE) MCG/ACT inhaler Inhale 2 puffs into the lungs every 6 (six) hours as needed for wheezing or shortness of breath.    [provider]  cetirizine (ZYRTEC) 10 MG tablet Take 10 mg by mouth daily.    [provider]  Dupilumab 300 MG/2ML SOPN Inject 2 mLs into the skin every 14 (fourteen) days. 07/24/20    [provider]  Fluticasone-Umeclidin-Vilant (TRELEGY ELLIPTA) 200-62.5-25 MCG/INH AEPB Inhale 1 puff into the lungs daily. 01/16/20   [provider]  meloxicam (MOBIC) 15 MG tablet Take 1 tablet by mouth daily as needed. 01/03/21   [provider]  montelukast (SINGULAIR) 10 MG tablet Take by mouth. 01/16/20   [provider]  omeprazole (PRILOSEC) 20 MG capsule Take 20 mg by mouth 2 (two) times daily before a meal.    [provider]  predniSONE (STERAPRED UNI-PAK 21 TAB) 10 MG (21) TBPK tablet Take 6 tablets on day 1, 5 tablets day 2, 4 tablets day 3, 3 tablets day 4, 2 tablets day 5, 1 tablet day 6 03/08/21   Becky Augusta, NP  rosuvastatin (CRESTOR) 40 MG tablet Take 1 tablet by mouth daily. 07/16/20   [provider]   Family History History reviewed. No pertinent family history. Social History Social History   Tobacco Use   Smoking status: Never   Smokeless tobacco: Never  Vaping Use   Vaping Use: Never used  Substance Use Topics   Alcohol use: Yes    Comment: occ   Drug use: No   Allergies   Other and Amoxicillin  Review of Systems Review of Systems Pertinent findings noted in history of present illness.   Physical Exam Triage Vital Signs ED Triage Vitals  Enc Vitals Group     BP 08/02/21 0827 (!) 147/82     Pulse Rate 08/02/21  0827 72     Resp 08/02/21 0827 18     Temp 08/02/21 0827 98.3 F (36.8 C)     Temp Source 08/02/21 0827 Oral     SpO2 08/02/21 0827 98 %     Weight --      Height --      Head Circumference --      Peak Flow --      Pain Score 08/02/21 0826 5     Pain Loc --      Pain Edu? --      Excl. in [place]? --   No data found.  Updated Vital Signs BP 113/77 (BP Location: Left Arm)   Pulse (!) 105   Temp (!) 103 F (39.4 C) (Oral)   Resp 18   SpO2 95%   Physical Exam Constitutional:      General: He is not in acute distress.    Appearance: He is well-developed. He is ill-appearing. He is  not toxic-appearing.  HENT:     Head: Normocephalic and atraumatic.     Salivary Glands: Right salivary gland is diffusely enlarged and tender. Left salivary gland is diffusely enlarged and tender.     Right Ear: Hearing and external ear normal.     Left Ear: Hearing and external ear normal.     Ears:     Comments: Bilateral EACs with mild erythema, bilateral TMs are normal    Nose: No mucosal edema, congestion or rhinorrhea.     Right Turbinates: Not enlarged, swollen or pale.     Left Turbinates: Not enlarged or swollen.     Right Sinus: No maxillary sinus tenderness or frontal sinus tenderness.     Left Sinus: No maxillary sinus tenderness or frontal sinus tenderness.     Mouth/Throat:     Lips: Pink. No lesions.     Mouth: Mucous membranes are moist. No oral lesions or angioedema.     Dentition: No gingival swelling.     Tongue: No lesions.     Palate: No mass.     Pharynx: Uvula midline. Pharyngeal swelling, oropharyngeal exudate and posterior oropharyngeal erythema present. No uvula swelling.     Tonsils: Tonsillar exudate present. 2+ on the right. 2+ on the left.  Eyes:     Extraocular Movements: Extraocular movements intact.     Conjunctiva/sclera: Conjunctivae normal.     Pupils: Pupils are equal, round, and reactive to light.  Neck:     Thyroid: No thyroid mass, thyromegaly or thyroid tenderness.     Trachea: Tracheal tenderness present. No abnormal tracheal secretions or tracheal deviation.     Comments: Voice is muffled Cardiovascular:     Rate and Rhythm: Normal rate and regular rhythm.     Pulses: Normal pulses.     Heart sounds: Normal heart sounds, S1 normal and S2 normal. No murmur heard.    No friction rub. No gallop.  Pulmonary:     Effort: Pulmonary effort is normal. No accessory muscle usage, prolonged expiration, respiratory distress or retractions.     Breath sounds: No stridor, decreased air movement or transmitted upper airway sounds. No decreased breath  sounds, wheezing, rhonchi or rales.  Abdominal:     General: Bowel sounds are normal.     Palpations: Abdomen is soft.     Tenderness: There is generalized abdominal tenderness. There is no right CVA tenderness, left CVA tenderness or rebound. Negative signs include Murphy's sign.     Hernia: No hernia is present.  Musculoskeletal:        General: No tenderness. Normal range of motion.     Cervical back: Full passive range of motion without pain, normal range of motion and neck supple.     Right lower leg: No edema.     Left lower leg: No edema.  Lymphadenopathy:     Cervical: Cervical adenopathy present.     Right cervical: Superficial cervical adenopathy present.     Left cervical: Superficial cervical adenopathy present.  Skin:    General: Skin is warm and dry.     Findings: No erythema, lesion or rash.  Neurological:     General: No focal deficit present.     Mental Status: He is alert and oriented to person, place, and time. Mental status is at baseline.  Psychiatric:        Mood and Affect: Mood normal.        Behavior: Behavior normal.        Thought Content: Thought content normal.        Judgment: Judgment normal.     Visual Acuity Right Eye Distance:   Left Eye Distance:   Bilateral Distance:    Right Eye Near:   Left Eye Near:    Bilateral Near:     UC Couse / Diagnostics / Procedures:    EKG  Radiology No results found.  Procedures Procedures (including critical care time)  UC Diagnoses / Final Clinical Impressions(s)   I have reviewed the triage vital signs and the nursing notes.  Pertinent labs & imaging results that were available during my care of the patient were reviewed by me and considered in my medical decision making (see chart for details).   Final diagnoses:  Streptococcal pharyngitis   Patient reports allergy to amoxicillin, states it causes hives, denies respiratory symptoms with exposure to amoxicillin, allergy list updated.   Prescription changed to cefdinir.  Return precautions advised.  ED Prescriptions     Medication Sig Dispense Auth. Provider   amoxicillin (AMOXIL) 500 MG capsule  (Status: Discontinued) Take 2 capsules (1,000 mg total) by mouth daily for 10 days. 20 capsule Theadora Rama Scales, PA-C   cefdinir (OMNICEF) 300 MG capsule Take 1 capsule (300 mg total) by mouth 2 (two) times daily for 10 days. 20 capsule Theadora Rama Scales, PA-C      PDMP not reviewed this encounter.  Pending results:  Labs Reviewed  POCT RAPID STREP A (OFFICE) - Abnormal; Notable for the following components:      Result Value   Rapid Strep A Screen Positive (*)    All other components within normal limits    Medications Ordered in UC: Medications  acetaminophen (TYLENOL) tablet 650 mg (650 mg Oral Given 03/16/22 1300)    Disposition Upon Discharge:  Condition: stable for discharge home Home: take medications as prescribed; routine discharge instructions as discussed; follow up as advised.  Patient presented with an acute illness with associated systemic symptoms and significant discomfort requiring urgent management. In my opinion, this is a condition that a prudent lay person (someone who possesses an average knowledge of health and medicine) may potentially expect to result in complications if not addressed urgently such as respiratory distress, impairment of bodily function or dysfunction of bodily organs.   Routine symptom specific, illness specific and/or disease specific instructions were discussed with the patient and/or caregiver at length.   As such, the patient has been evaluated and assessed, work-up was performed and treatment was provided in alignment  with urgent care protocols and evidence based medicine.  Patient/parent/caregiver has been advised that the patient may require follow up for further testing and treatment if the symptoms continue in spite of treatment, as clinically indicated and  appropriate.  If the patient was tested for COVID-19, Influenza and/or RSV, then the patient/parent/guardian was advised to isolate at home pending the results of his/her diagnostic coronavirus test and potentially longer if they're positive. I have also advised pt that if his/her COVID-19 test returns positive, it's recommended to self-isolate for at least 10 days after symptoms first appeared AND until fever-free for 24 hours without fever reducer AND other symptoms have improved or resolved. Discussed self-isolation recommendations as well as instructions for household member/close contacts as per the [facility] and [place] DHHS, and also gave patient the COVID packet with this information.  Patient/parent/caregiver has been advised [facility] in 3-5 days if no better; to PCP or the Emergency Department if new signs and symptoms develop, or if the current signs or symptoms continue to change or worsen for further workup, evaluation and treatment as clinically indicated and appropriate  The patient will follow up with their current PCP if and as advised. If the patient does not currently have a PCP we will assist them in obtaining one.   The patient may need specialty follow up if the symptoms continue, in spite of conservative treatment and management, for further workup, evaluation, consultation and treatment as clinically indicated and appropriate.  Patient/parent/caregiver verbalized understanding and agreement of plan as discussed.  All questions were addressed during visit.  Please see discharge instructions below for further details of plan.  Discharge Instructions:   Discharge Instructions      Your strep test today is positive.  I recommend that you begin antibiotics now for treatment.  I have sent a prescription to your pharmacy.  Please take them as prescribed.  After 24 hours of antibiotics, please discard your toothbrush as well as any other oral devices that you are currently  using and replace them with new ones to avoid reinfection.  You should begin to feel better in 24 to 48 hours.   Once you have been on antibiotics for a full 24 hours, you are no longer considered contagious.   I have provided you with a note to return to work.   Even if you are feeling better, please make sure that you finish the full 10-day course and do not skip any doses.  Failure to complete a full course of antibiotics for strep throat can result in worsening infection that may require longer treatment with stronger antibiotics.   Please see the list below for recommended medications, dosages and frequencies to provide relief of your current symptoms:     Omnicef (cefdinir):  1 capsule twice daily for 10 days, you can take it with or without food.  This antibiotic can cause upset stomach, this will resolve once antibiotics are complete.  You are welcome to use a probiotic, eat yogurt, take Imodium while taking this medication.  Please avoid other systemic medications such as Maalox, Pepto-Bismol or milk of magnesia as they can interfere with your body's ability to absorb the antibiotics.  Advil, Motrin (ibuprofen): This is a good anti-inflammatory medication which addresses aches, pains and inflammation of the upper airways that causes sinus and nasal congestion as well as in the lower airways which makes your cough feel tight and sometimes burn.  I recommend that you take between  400 to 600 mg every 6-8 hours as needed.      Tylenol (acetaminophen): This is a good fever reducer.  If your body temperature rises above 101.5 as measured with a thermometer, it is recommended that you take 1,000 mg every 8 hours until your temperature falls below 101.5, please not take more than 3,000 mg of acetaminophen either as a separate medication or as in ingredient in an over-the-counter cold/flu preparation within a 24-hour period.      Please follow-up within the next 7-10 days either with your primary care  provider or urgent care if your symptoms do not resolve.  If you do not have a primary care provider, we will assist you in finding one.   Thank you for visiting urgent care today.  We appreciate the opportunity to participate in your care.     This office note has been dictated using Teaching laboratory technician.  Unfortunately, and despite my best efforts, this method of dictation can sometimes lead to occasional typographical or grammatical errors.  I apologize in advance if this occurs.     Theadora Rama Scales, PA-C 03/17/22 1250

## 2022-04-17 IMAGING — CR DG CHEST 2V
2 series · 2 of 2 positions shown · non-contrast
Comparison: January 17, 2021

CLINICAL DATA: Chest pain

EXAM:
CHEST - 2 VIEW

[chest pa]
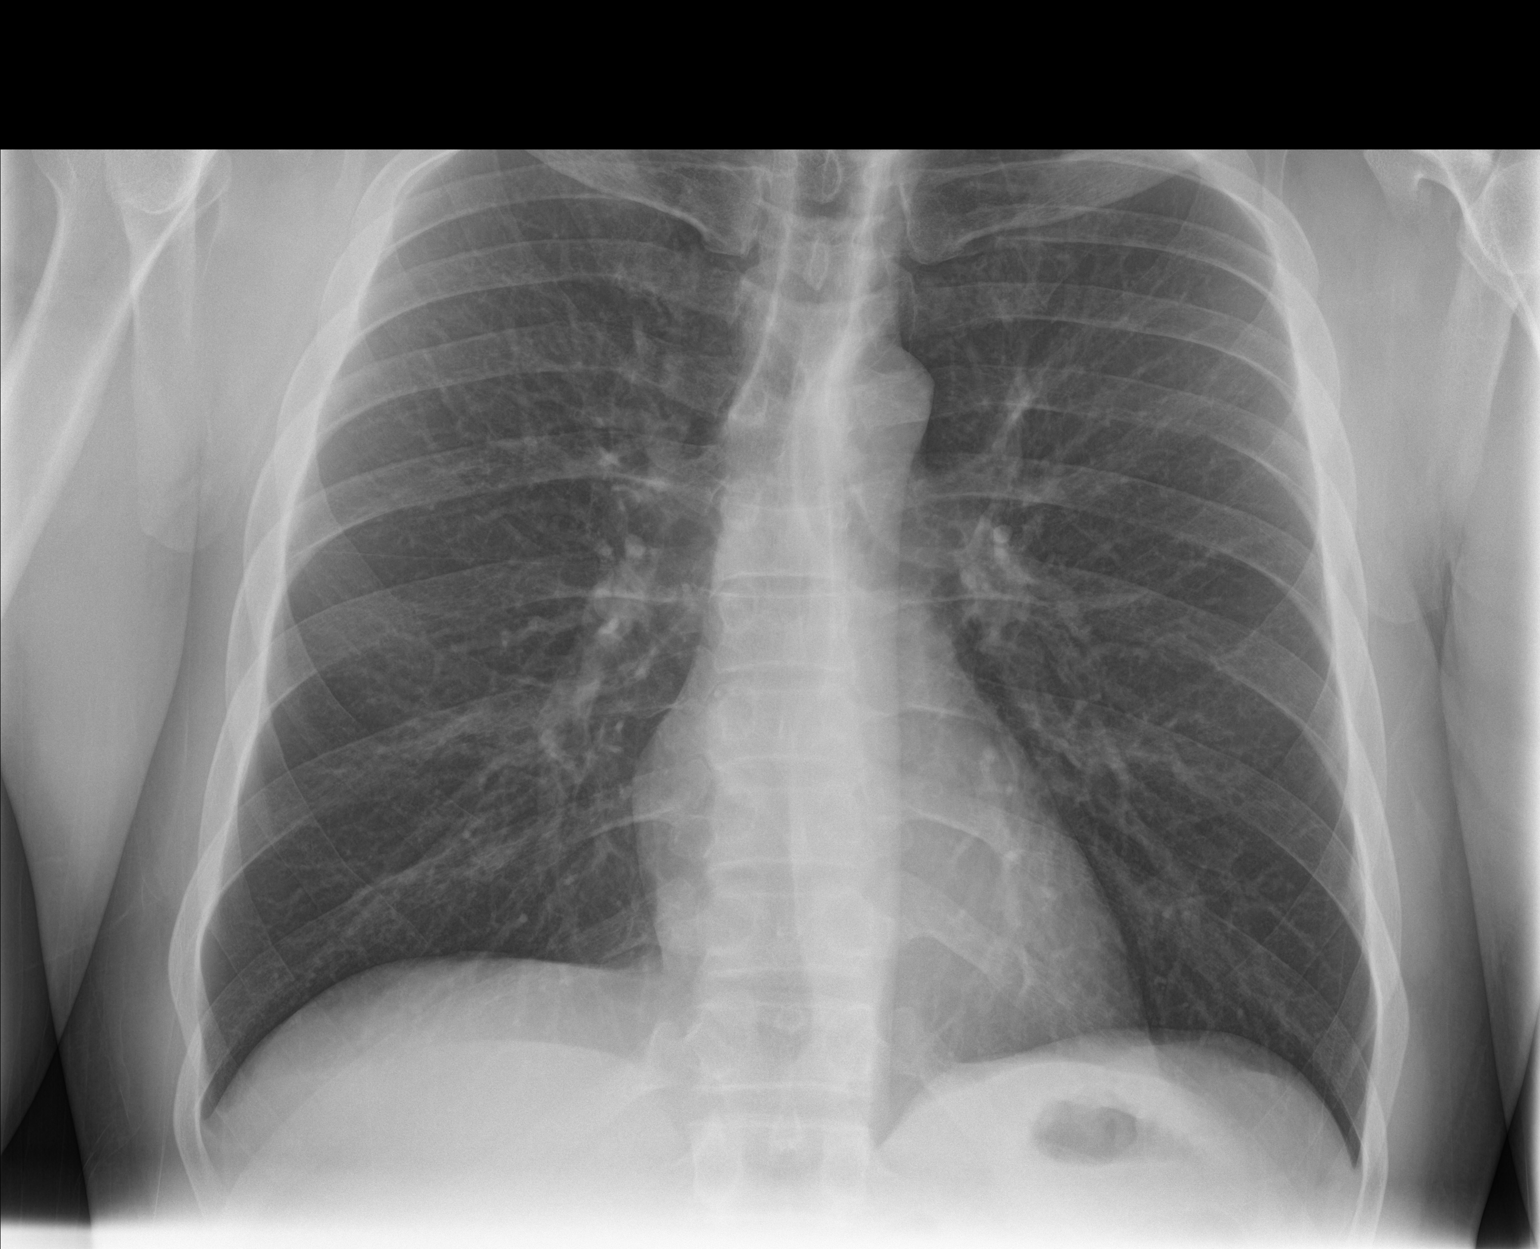

[chest lat]
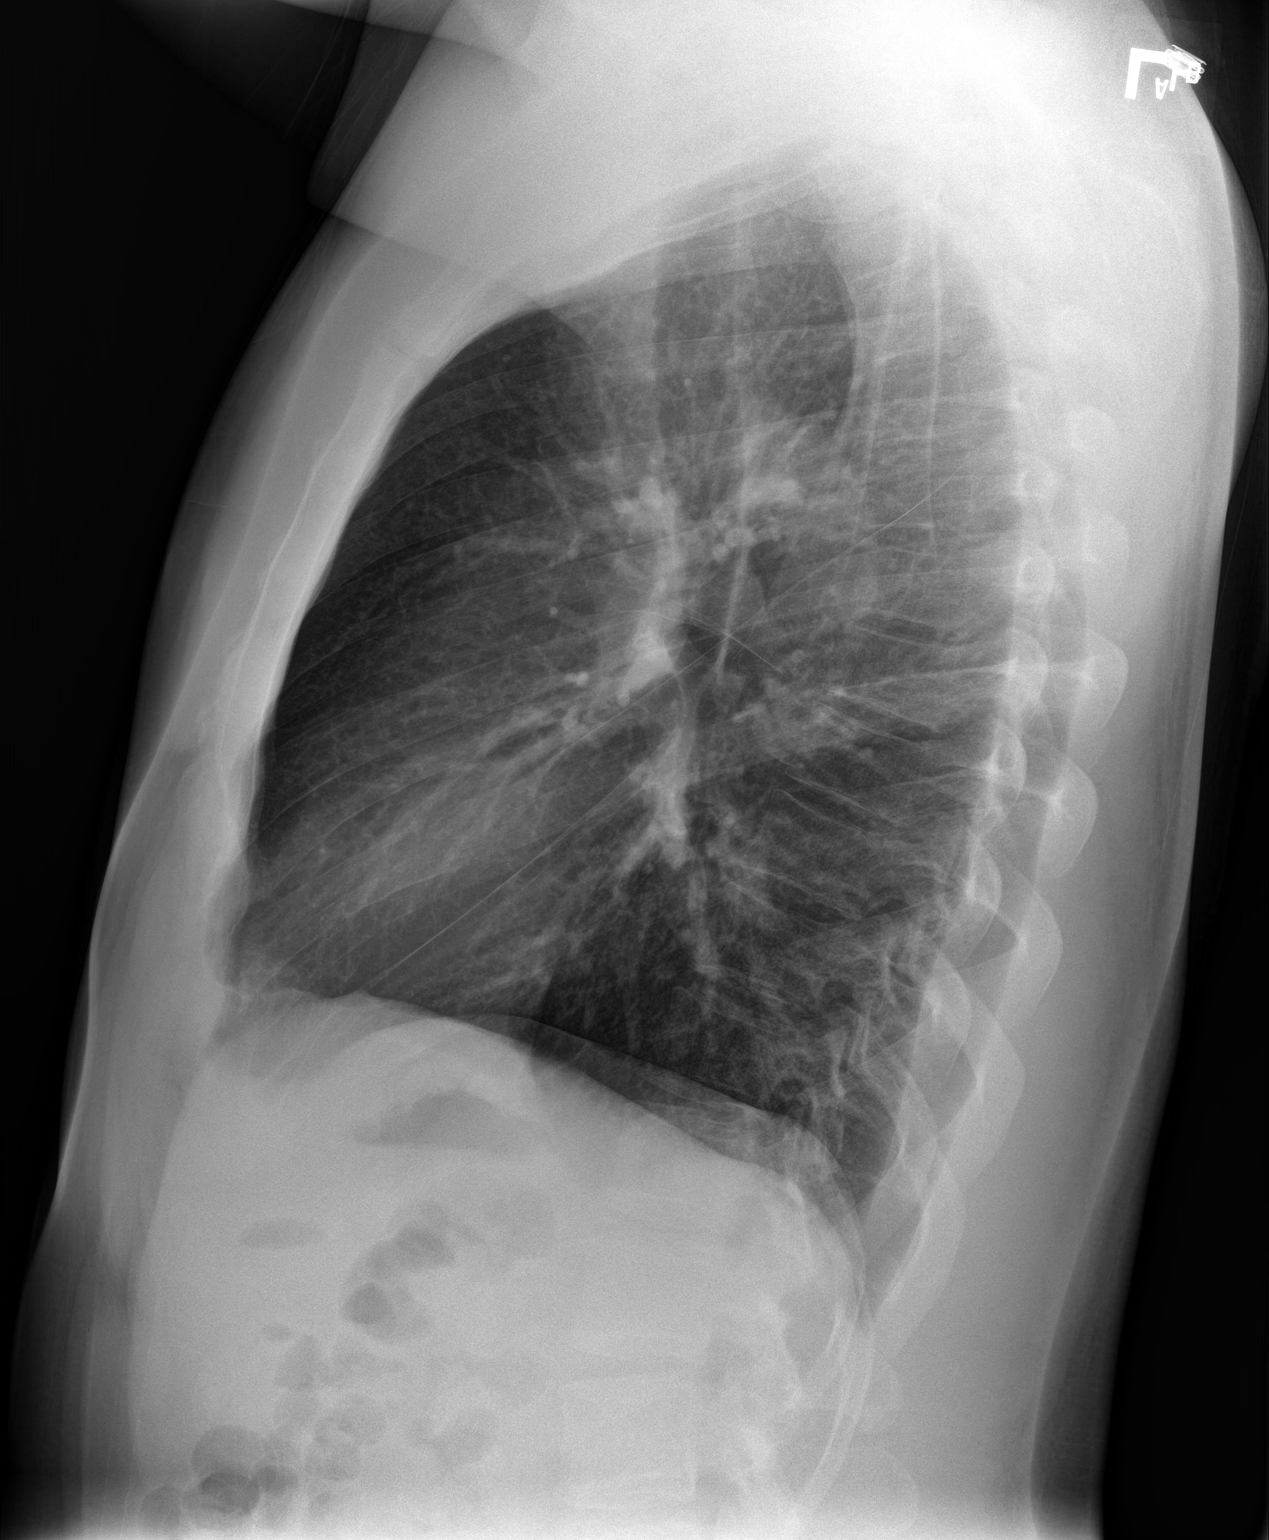

[2 of 2 positions shown; findings below may reference images not displayed]

FINDINGS: The lungs are clear. The heart size and pulmonary vascularity are
normal. No adenopathy. No bone lesions. No pneumothorax.
IMPRESSION: Lungs clear.  Cardiac silhouette normal.

## 2022-09-05 DIAGNOSIS — Z419 Encounter for procedure for purposes other than remedying health state, unspecified: Secondary | ICD-10-CM | POA: Diagnosis not present

## 2022-10-06 DIAGNOSIS — Z419 Encounter for procedure for purposes other than remedying health state, unspecified: Secondary | ICD-10-CM | POA: Diagnosis not present

## 2022-12-25 ENCOUNTER — Ambulatory Visit
Admission: RE | Admit: 2022-12-25 | Discharge: 2022-12-25 | Disposition: A | Discharge status: Home / Self Care | Payer: 59 | Source: Ambulatory Visit | Attending: Internal Medicine | Admitting: Internal Medicine

## 2022-12-25 VITALS — BP 158/85 | HR 91 | Temp 98.1°F | Resp 17

## 2022-12-25 DIAGNOSIS — J4541 Moderate persistent asthma with (acute) exacerbation: Secondary | ICD-10-CM | POA: Diagnosis not present

## 2022-12-25 MED ORDER — PREDNISONE 20 MG PO TABS: 40.0000 mg | ORAL_TABLET | Freq: Every day | ORAL | 0 refills | Status: AC

## 2022-12-25 NOTE — Discharge Instructions (Signed)
Prednisone 40mg  once daily for the next 5 days with food.  No ibuprofen when taking prednisone.  Continue albuterol inhaler as needed.   Follow-up with pulmonology/primary care.  If you develop any new or worsening symptoms or do not improve in the next 2 to 3 days, please return.  If your symptoms are severe, please go to the emergency room.  Follow-up with your primary care provider for further evaluation and management of your symptoms as well as ongoing wellness visits.  I hope you feel better!

## 2022-12-25 NOTE — ED Provider Notes (Signed)
[facility]    CSN: [id number] Arrival date & time: 12/25/22  1058      History   Chief Complaint Chief Complaint  Patient presents with   Wheezing    Entered by patient   Shortness of Breath    HPI Brian Moreno is a 37 y.o. male.   Patient presents to urgent care for evaluation of cough and shortness of breath with exertion that started approximately 5 days ago. He has a history of asthma and has been using his albuterol inhaler with relief of symptoms. Shortness of breath mostly happens when he "gets up to do something at work".  Denise chest pain, heart palpitations, nausea, vomiting, abdominal pain, ear pain, headache, dizziness, and fatigue. He has had a sore throat intermittently over the last few days. Took his temperature a couple of days ago and it was 99.5 at the highest. No recent known sick contacts with similar symptoms. Symptoms have responded well to tylenol and albuterol intermittently. He denies shortness of breath at rest. History of allergic rhinitis as well. Has been using asthma and allergy medications as prescribed. He has not been to the pulmonologist or PCP in "a while" and states he plans on making an appointment to follow-up soon. Used albuterol shortly prior to arrival at urgent care. Denies recent antibiotic or steroid use.    Wheezing Associated symptoms: shortness of breath   Shortness of Breath Associated symptoms: wheezing     Past Medical History:  Diagnosis Date   Asthma     Patient Active Problem List   Diagnosis Date Noted   Gastroesophageal reflux disease without esophagitis 09/10/2020   Hyperlipidemia LDL goal <130 07/16/2020   Pneumococcal vaccine refused 07/13/2020   Moderate persistent asthma, uncomplicated 0000000   Non-seasonal allergic rhinitis 02/18/2018    History reviewed. No pertinent surgical history.     Home Medications    Prior to Admission medications   Medication Sig Start Date End Date Taking?  Authorizing Provider  predniSONE (DELTASONE) 20 MG tablet Take 2 tablets (40 mg total) by mouth daily for 5 days. 12/25/22 12/30/22 Yes Talbot Grumbling, FNP  acetaminophen (TYLENOL) 650 MG CR tablet Take 650 mg by mouth every 8 (eight) hours as needed for pain.    [provider]  albuterol (PROVENTIL HFA;VENTOLIN HFA) 108 (90 BASE) MCG/ACT inhaler Inhale 2 puffs into the lungs every 6 (six) hours as needed for wheezing or shortness of breath.    [provider]  cetirizine (ZYRTEC) 10 MG tablet Take 10 mg by mouth daily.    [provider]  Dupilumab 300 MG/2ML SOPN Inject 2 mLs into the skin every 14 (fourteen) days. 07/24/20   [provider]  Fluticasone-Umeclidin-Vilant (TRELEGY ELLIPTA) 200-62.5-25 MCG/INH AEPB Inhale 1 puff into the lungs daily. 01/16/20   [provider]  meloxicam (MOBIC) 15 MG tablet Take 1 tablet by mouth daily as needed. 01/03/21   [provider]  montelukast (SINGULAIR) 10 MG tablet Take by mouth. 01/16/20   [provider]  omeprazole (PRILOSEC) 20 MG capsule Take 20 mg by mouth 2 (two) times daily before a meal.    [provider]  rosuvastatin (CRESTOR) 40 MG tablet Take 1 tablet by mouth daily. 07/16/20   [provider]    Family History History reviewed. No pertinent family history.  Social History Social History   Tobacco Use   Smoking status: Never   Smokeless tobacco: Never  Vaping Use   Vaping Use: Never  used  Substance Use Topics   Alcohol use: Yes    Comment: occ   Drug use: No     Allergies   Other and Amoxicillin   Review of Systems Review of Systems  Respiratory:  Positive for shortness of breath and wheezing.   Per HPI   Physical Exam Triage Vital Signs ED Triage Vitals  Enc Vitals Group     BP 12/25/22 1112 (!) 158/85     Pulse Rate 12/25/22 1112 91     Resp 12/25/22 1112 17     Temp 12/25/22 1112 98.1 F (36.7 C)     Temp Source 12/25/22  1112 Oral     SpO2 12/25/22 1112 94 %     Weight --      Height --      Head Circumference --      Peak Flow --      Pain Score 12/25/22 1114 0     Pain Loc --      Pain Edu? --      Excl. in [place]? --    No data found.  Updated Vital Signs BP (!) 158/85 (BP Location: Left Arm)   Pulse 91   Temp 98.1 F (36.7 C) (Oral)   Resp 17   SpO2 94%   Visual Acuity Right Eye Distance:   Left Eye Distance:   Bilateral Distance:    Right Eye Near:   Left Eye Near:    Bilateral Near:     Physical Exam Vitals and nursing note reviewed.  Constitutional:      Appearance: He is not ill-appearing or toxic-appearing.  HENT:     Head: Normocephalic and atraumatic.     Right Ear: Hearing, tympanic membrane, ear canal and external ear normal.     Left Ear: Hearing, tympanic membrane, ear canal and external ear normal.     Nose: Nose normal.     Mouth/Throat:     Lips: Pink.     Mouth: Mucous membranes are moist. No injury.     Tongue: No lesions. Tongue does not deviate from midline.     Palate: No mass and lesions.     Pharynx: Oropharynx is clear. Uvula midline. Posterior oropharyngeal erythema present. No pharyngeal swelling, oropharyngeal exudate or uvula swelling.     Tonsils: No tonsillar exudate or tonsillar abscesses.  Eyes:     General: Lids are normal. Vision grossly intact. Gaze aligned appropriately.     Extraocular Movements: Extraocular movements intact.     Conjunctiva/sclera: Conjunctivae normal.  Cardiovascular:     Rate and Rhythm: Normal rate and regular rhythm.     Heart sounds: Normal heart sounds, S1 normal and S2 normal.  Pulmonary:     Effort: Pulmonary effort is normal. No tachypnea, accessory muscle usage or respiratory distress.     Breath sounds: Normal breath sounds and air entry. No decreased breath sounds, wheezing, rhonchi or rales.  Musculoskeletal:     Cervical back: Neck supple.  Lymphadenopathy:     Cervical: No cervical adenopathy.  Skin:     General: Skin is warm and dry.     Capillary Refill: Capillary refill takes less than 2 seconds.     Findings: No rash.  Neurological:     General: No focal deficit present.     Mental Status: He is alert and oriented to person, place, and time. Mental status is at baseline.     Cranial Nerves: No dysarthria or facial asymmetry.  Psychiatric:  Mood and Affect: Mood normal.        Speech: Speech normal.        Behavior: Behavior normal.        Thought Content: Thought content normal.        Judgment: Judgment normal.      UC Treatments / Results  Labs (all labs ordered are listed, but only abnormal results are displayed) Labs Reviewed - No data to display  EKG   Radiology No results found.  Procedures Procedures (including critical care time)  Medications Ordered in UC Medications - No data to display  Initial Impression / Assessment and Plan / UC Course  I have reviewed the triage vital signs and the nursing notes.  Pertinent labs & imaging results that were available during my care of the patient were reviewed by me and considered in my medical decision making (see chart for details).   1. Moderate persistent asthma with acute exacerbation Presentation consistent with mild asthma exacerbation. Takes Trelegy Ellipta and singlulair without missed doses. Recently used albuterol inhaler, so no wheezing heard to lungs. Deferred imaging based on stable cardiopulmonary exam and hemodynamically stable vital signs. No signs of respiratory distress or systemic symptoms. Prednisone 40mg  burst sent to pharmacy to be taken as prescribed for 5 days. No NSAID when taking this. Advised to take with food  to avoid stomach upset. Encouraged follow-up with PCP and pulmonology to discuss asthma action plan further. May continue using albuterol inhaler as needed.   Discussed physical exam and available lab work findings in clinic with patient.  Counseled patient regarding appropriate use  of medications and potential side effects for all medications recommended or prescribed today. Discussed red flag signs and symptoms of worsening condition,when to call the PCP office, return to urgent care, and when to seek higher level of care in the emergency department. Patient verbalizes understanding and agreement with plan. All questions answered. Patient discharged in stable condition.    Final Clinical Impressions(s) / UC Diagnoses   Final diagnoses:  Moderate persistent asthma with (acute) exacerbation     Discharge Instructions      Prednisone 40mg  once daily for the next 5 days with food.  No ibuprofen when taking prednisone.  Continue albuterol inhaler as needed.   Follow-up with pulmonology/primary care.  If you develop any new or worsening symptoms or do not improve in the next 2 to 3 days, please return.  If your symptoms are severe, please go to the emergency room.  Follow-up with your primary care provider for further evaluation and management of your symptoms as well as ongoing wellness visits.  I hope you feel better!   ED Prescriptions     Medication Sig Dispense Auth. Provider   predniSONE (DELTASONE) 20 MG tablet Take 2 tablets (40 mg total) by mouth daily for 5 days. 10 tablet Talbot Grumbling, FNP      PDMP not reviewed this encounter.   Talbot Grumbling, [place] 12/25/22 1156

## 2022-12-25 NOTE — ED Triage Notes (Signed)
Pt presents with wheezing and shortness of breath X 5 days with minimal relief with inhaler.

## 2023-01-14 ENCOUNTER — Encounter: Payer: 59 | Admitting: Family

## 2023-01-14 NOTE — Progress Notes (Signed)
Erroneous encounter-disregard

## 2024-03-30 ENCOUNTER — Inpatient Hospital Stay
Admission: RE | Admit: 2024-03-30 | Discharge: 2024-03-30 | Disposition: A | Discharge status: Home / Self Care | Payer: Self-pay | Source: Ambulatory Visit | Attending: Physician Assistant | Admitting: Physician Assistant

## 2024-03-30 ENCOUNTER — Ambulatory Visit: Admission: EM | Admit: 2024-03-30 | Discharge: 2024-03-30 | Disposition: A | Discharge status: Home / Self Care

## 2024-03-30 DIAGNOSIS — H1032 Unspecified acute conjunctivitis, left eye: Secondary | ICD-10-CM

## 2024-03-30 MED ORDER — TOBRAMYCIN 0.3 % OP SOLN: 1.0000 [drp] | OPHTHALMIC | 0 refills | Status: AC

## 2024-03-30 NOTE — ED Triage Notes (Signed)
 Something is going on with my left eye, itchy, scratchy, no injury known, first noticed next day after my son's graduation cook out about 2 wks ago. Now maybe a rash below it.

## 2024-03-30 NOTE — ED Provider Notes (Signed)
 [facility]    CSN: [id number] Arrival date & time: 03/30/24  1054      History   Chief Complaint Chief Complaint  Patient presents with   Eye Problem    HPI Brian Moreno is a 38 y.o. male.   Pt complains of eye irritation.  Pt reports he has had drainage in the am.  Pt reports irritation for the past 2 weeks. No fever or chills.  No cough or congestion.  No fever or chills.  No visual change.   The history is provided by the patient. No language interpreter was used.  Eye Problem   Past Medical History:  Diagnosis Date   Asthma     Patient Active Problem List   Diagnosis Date Noted   Musculoskeletal chest pain 06/25/2021   Gastroesophageal reflux disease without esophagitis 09/10/2020   Hyperlipidemia LDL goal <130 07/16/2020   Pneumococcal vaccine refused 07/13/2020   Moderate persistent asthma, uncomplicated 02/18/2018   Non-seasonal allergic rhinitis 02/18/2018    History reviewed. No pertinent surgical history.     Home Medications    Prior to Admission medications   Medication Sig Start Date End Date Taking? Authorizing Provider  albuterol  (PROVENTIL  HFA;VENTOLIN  HFA) 108 (90 BASE) MCG/ACT inhaler Inhale 2 puffs into the lungs every 6 (six) hours as needed for wheezing or shortness of breath.   Yes [provider]  albuterol  (PROVENTIL ) (2.5 MG/3ML) 0.083% nebulizer solution Inhale 2.5 mg into the lungs every 4 (four) hours as needed. 11/29/20 03/07/25 Yes [provider]  ALBUTEROL  IN Albuterol  12/12/21  Yes [provider]  budesonide  (PULMICORT ) 0.5 MG/2ML nebulizer solution Take 0.5 mg by nebulization daily. 11/29/20  Yes [provider]  budesonide -formoterol  (SYMBICORT ) 160-4.5 MCG/ACT inhaler Inhale 2 puffs into the lungs 2 (two) times daily. 03/07/24  Yes [provider]  cetirizine (ZYRTEC) 10 MG tablet Take 10 mg by mouth daily.   Yes [provider]  DULoxetine (CYMBALTA) 20 MG capsule  Take 20 mg by mouth daily. 07/09/22  Yes [provider]  ezetimibe (ZETIA) 10 MG tablet Take 10 mg by mouth daily. 03/14/21  Yes [provider]  gabapentin (NEURONTIN) 300 MG capsule Take 300 mg by mouth 2 (two) times daily. 12/17/21  Yes [provider]  montelukast (SINGULAIR) 10 MG tablet Take by mouth. 01/16/20  Yes [provider]  omeprazole (PRILOSEC) 20 MG capsule Take 20 mg by mouth 2 (two) times daily before a meal.   Yes [provider]  rosuvastatin (CRESTOR) 40 MG tablet Take 1 tablet by mouth daily. 07/16/20  Yes [provider]  tezepelumab-ekko (TEZSPIRE) 210 MG/1. syringe Inject 210 mg into the skin. 04/12/21  Yes [provider]  tobramycin (TOBREX) 0.3 % ophthalmic solution Place 1 drop into the right eye every 4 (four) hours for 10 days. 03/30/24 04/09/24 Yes Mohmmad Saleeby K, PA-C  acetaminophen  (TYLENOL ) 650 MG CR tablet Take 650 mg by mouth every 8 (eight) hours as needed for pain.    [provider]  Dupilumab 300 MG/2ML SOPN Inject 2 mLs into the skin every 14 (fourteen) days. 07/24/20   [provider]  Fluticasone-Umeclidin-Vilant (TRELEGY ELLIPTA) 200-62.5-25 MCG/INH AEPB Inhale 1 puff into the lungs daily. 01/16/20   [provider]  meloxicam (MOBIC) 15 MG tablet Take 1 tablet by mouth daily as needed. 01/03/21   [provider]  Olopatadine HCl 0.2 % SOLN 1 drop into affected eye Ophthalmic Once a day    [provider]    Family History History reviewed. No pertinent family history.  Social History Social History   Tobacco Use   Smoking status: Never   Smokeless tobacco: Never  Vaping Use   Vaping status: Never Used  Substance Use Topics   Alcohol use: Yes    Comment: Occassionally.   Drug use: No     Allergies   Amoxicillin , Other, Shellfish allergy, and Shellfish-derived products   Review of Systems Review of Systems  HENT:  Negative for  congestion and rhinorrhea.   All other systems reviewed and are negative.    Physical Exam Triage Vital Signs ED Triage Vitals  Encounter Vitals Group     BP 03/30/24 1127 107/72     Girls Systolic BP Percentile --      Girls Diastolic BP Percentile --      Boys Systolic BP Percentile --      Boys Diastolic BP Percentile --      Pulse Rate 03/30/24 1127 84     Resp 03/30/24 1127 (!) 22     Temp 03/30/24 1127 98.4 F (36.9 C)     Temp Source 03/30/24 1127 Oral     SpO2 03/30/24 1127 91 %     Weight 03/30/24 1124 225 lb (102.1 kg)     Height 03/30/24 1124 6' (1.829 m)     Head Circumference --      Peak Flow --      Pain Score 03/30/24 1122 0     Pain Loc --      Pain Education --      Exclude from Growth Chart --    No data found.  Updated Vital Signs BP 107/72 (BP Location: Left Arm)   Pulse 84   Temp 98.4 F (36.9 C) (Oral)   Resp (!) 22   Ht 6' (1.829 m)   Wt 102.1 kg   SpO2 93% Comment: With seperate device (Non [employer] Sensor)  BMI 30.52 kg/m   Visual Acuity Right Eye Distance: 20/30 (Uncorrected) Left Eye Distance: 20/25 (Uncorrected) Bilateral Distance: 20/25 (Uncorrected, Vision Test (Colors only 3- Pass))  Right Eye Near:   Left Eye Near:    Bilateral Near:     Physical Exam Vitals and nursing note reviewed.  Constitutional:      Appearance: He is well-developed.  HENT:     Head: Normocephalic.     Mouth/Throat:     Mouth: Mucous membranes are moist.   Eyes:     General:        Right eye: Discharge present.     Extraocular Movements: Extraocular movements intact.     Pupils: Pupils are equal, round, and reactive to light.     Comments: Injected conjunctiva   Cardiovascular:     Rate and Rhythm: Normal rate.  Pulmonary:     Effort: Pulmonary effort is normal.  Abdominal:     General: There is no distension.   Skin:    General: Skin is warm.   Neurological:     General: No focal deficit present.     Mental Status: He is alert  and oriented to person, place, and time.   Psychiatric:        Mood and Affect: Mood normal.      UC Treatments / Results  Labs (all labs ordered are listed, but only abnormal results are displayed) Labs Reviewed - No data to display  EKG   Radiology No results found.  Procedures Procedures (including  critical care time)  Medications Ordered in UC Medications - No data to display  Initial Impression / Assessment and Plan / UC Course  I have reviewed the triage vital signs and the nursing notes.  Pertinent labs & imaging results that were available during my care of the patient were reviewed by me and considered in my medical decision making (see chart for details).      Final Clinical Impressions(s) / UC Diagnoses   Final diagnoses:  Acute bacterial conjunctivitis of left eye     Discharge Instructions      Return if any problems.     ED Prescriptions     Medication Sig Dispense Auth. Provider   tobramycin (TOBREX) 0.3 % ophthalmic solution Place 1 drop into the right eye every 4 (four) hours for 10 days. 5 mL [place] Sonny POUR, PA-C      PDMP not reviewed this encounter. An After Visit Summary was printed and given to the patient.       Fl[place] Sonny POUR, PA-C 03/30/24 1201

## 2024-03-30 NOTE — Discharge Instructions (Addendum)
 Return if any problems.
